# Patient Record
Sex: Female | Born: 1965 | Race: White | Hispanic: No | Marital: Married | Smoking: Never smoker
Health system: Southern US, Community
[De-identification: ages and names within clinical notes are randomized; demographics above are authoritative.]

## PROBLEM LIST (undated history)

## (undated) DIAGNOSIS — N200 Calculus of kidney: Secondary | ICD-10-CM

## (undated) DIAGNOSIS — G610 Guillain-Barre syndrome: Secondary | ICD-10-CM

## (undated) DIAGNOSIS — Z87442 Personal history of urinary calculi: Secondary | ICD-10-CM

## (undated) HISTORY — PX: CYSTOSCOPY W/ URETEROSCOPY W/ LITHOTRIPSY: SUR380

## (undated) HISTORY — PX: OTHER SURGICAL HISTORY: SHX169

---

## 2010-02-08 ENCOUNTER — Ambulatory Visit: Payer: Self-pay | Admitting: Emergency Medicine

## 2014-01-21 DIAGNOSIS — M792 Neuralgia and neuritis, unspecified: Secondary | ICD-10-CM | POA: Insufficient documentation

## 2014-02-19 ENCOUNTER — Ambulatory Visit: Payer: Self-pay | Admitting: Gastroenterology

## 2015-12-29 IMAGING — CT CT ABD-PELV W/ CM
2 of 5 series · 17 of 46 positions shown, 19 images · IV contrast (isovue)
Comparison: None.

CLINICAL DATA: Left lower quadrant pain.

EXAM:
CT ABDOMEN AND PELVIS WITH CONTRAST
TECHNIQUE: Multidetector CT imaging of the abdomen and pelvis was performed
using the standard protocol following bolus administration of
intravenous contrast.
CONTRAST:  85 mL Isovue 370

[Series 2: axial soft tissue · axial · 0.62mm/px · z∈[-786,-426]mm · 14 of 82 slices shown, 16 images]
[im 5/82  soft-tissue]
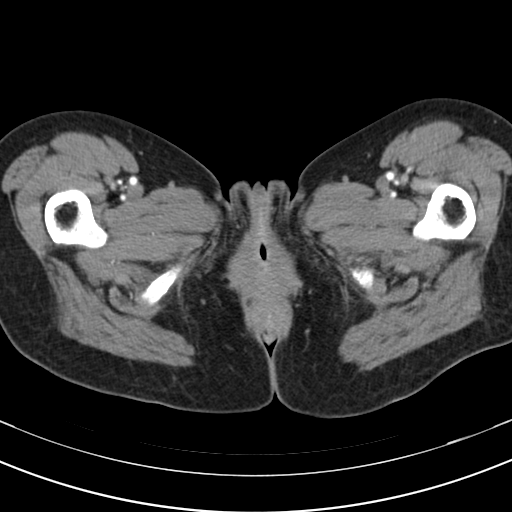
[im 5/82  bone]
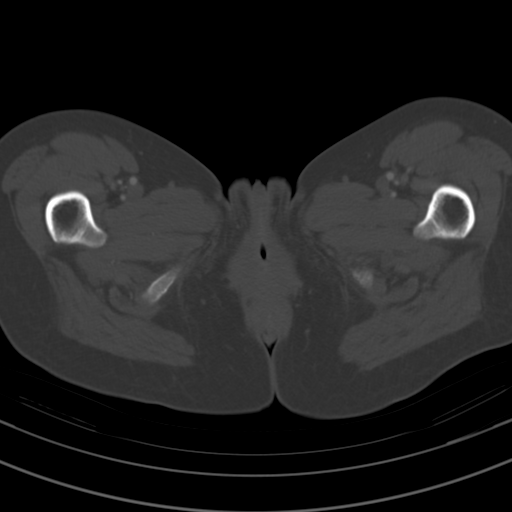
[im 9/82  soft-tissue]
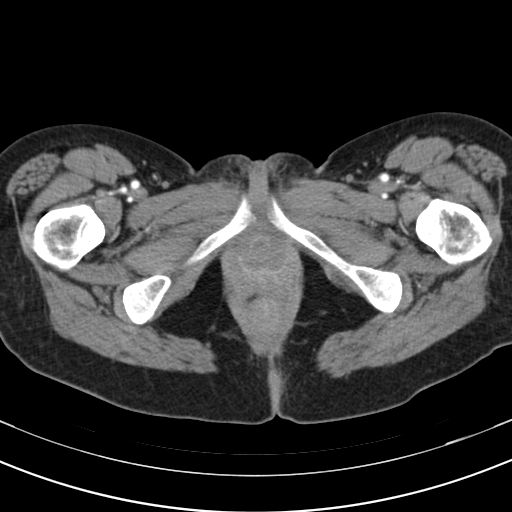
[im 18/82  soft-tissue]
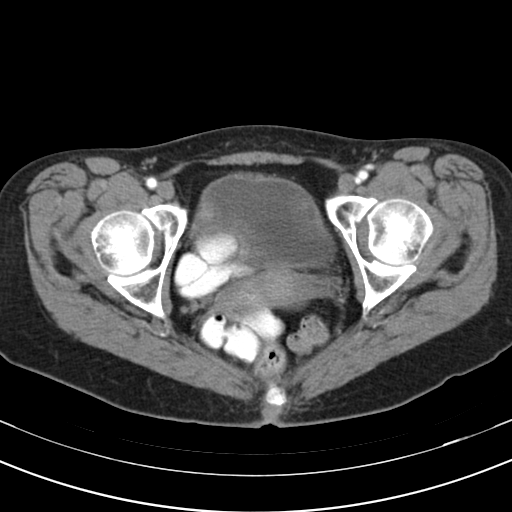
[im 22/82  soft-tissue]
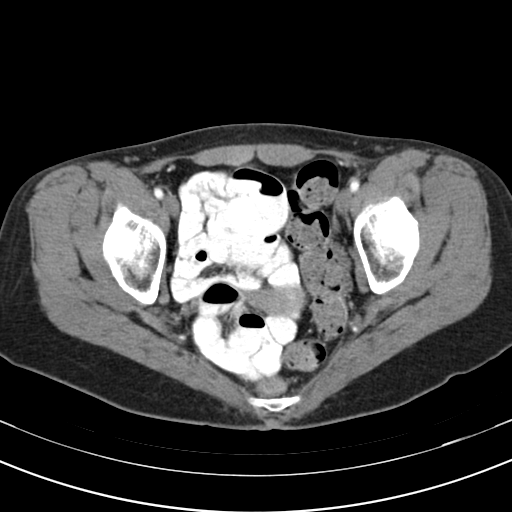
[im 26/82  soft-tissue]
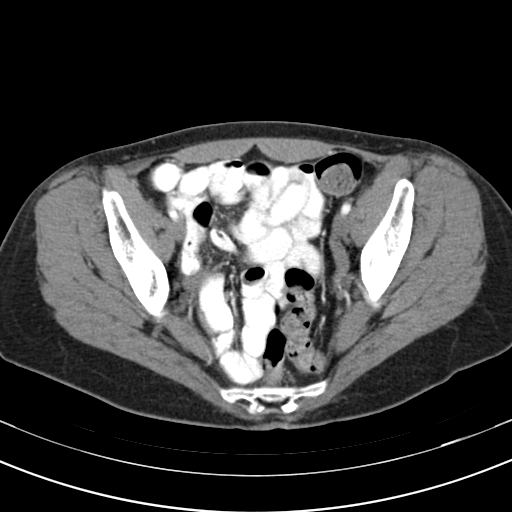
[im 35/82  soft-tissue]
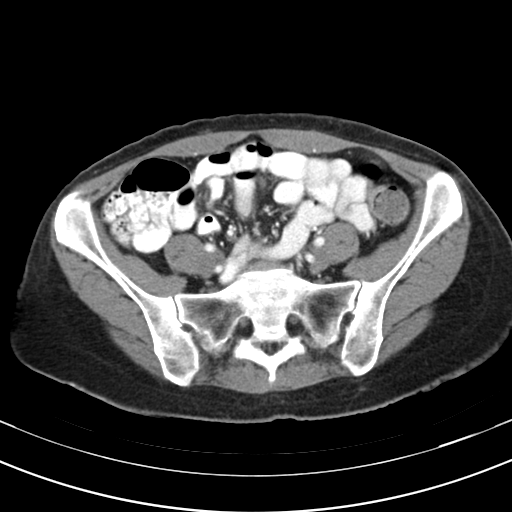
[im 39/82  soft-tissue]
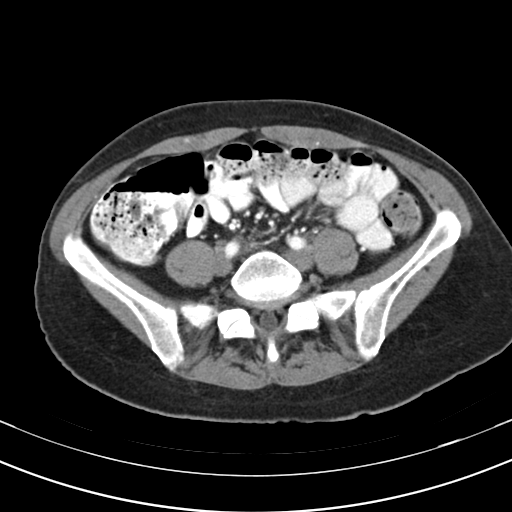
[im 43/82  soft-tissue]
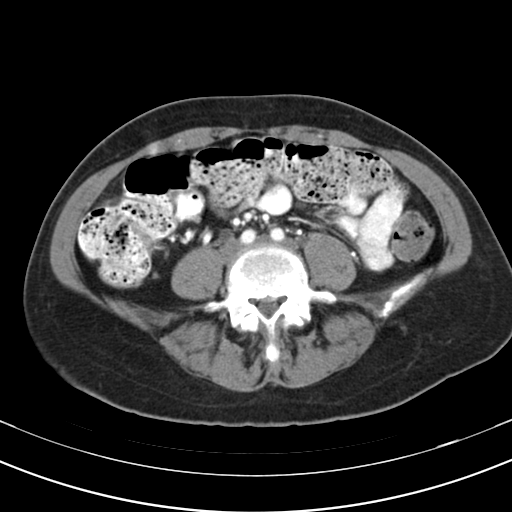
[im 47/82  soft-tissue]
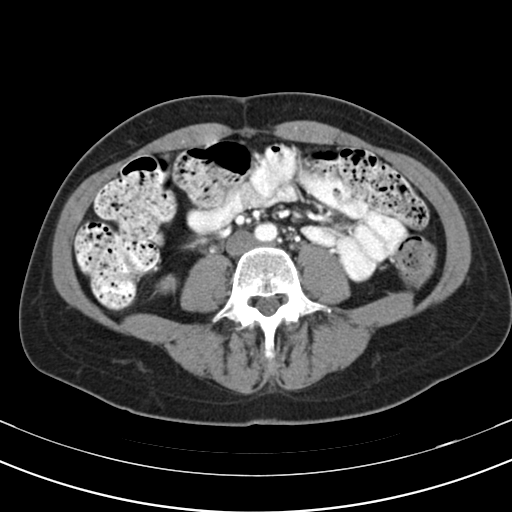
[im 47/82  bone]
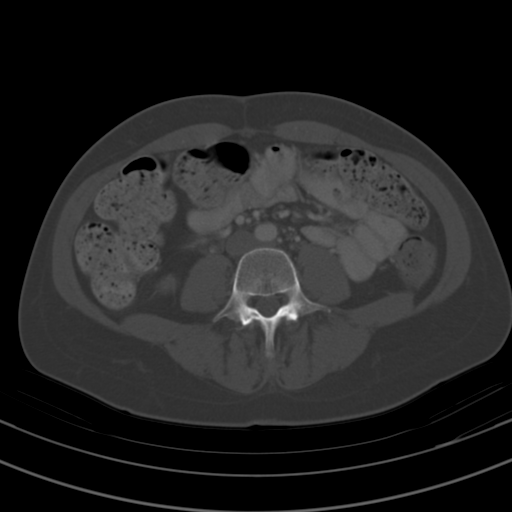
[im 56/82  soft-tissue]
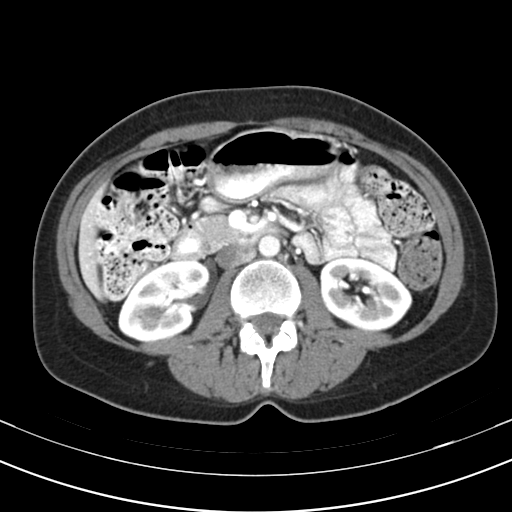
[im 60/82  soft-tissue]
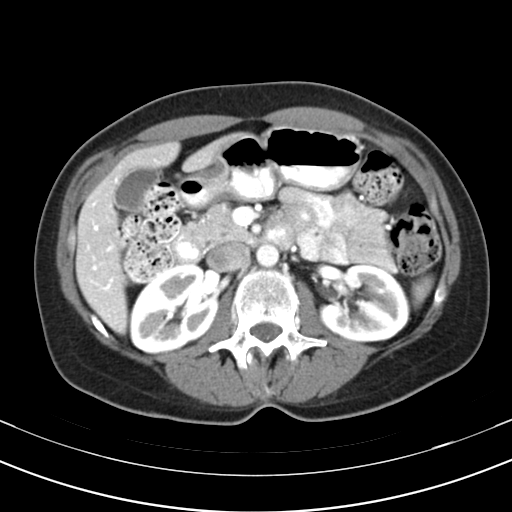
[im 64/82  soft-tissue]
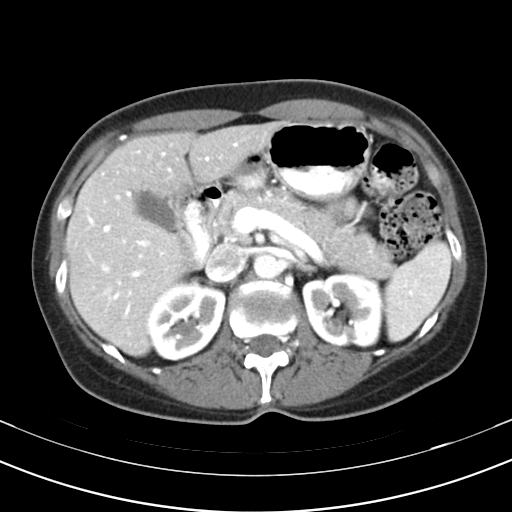
[im 73/82  soft-tissue]
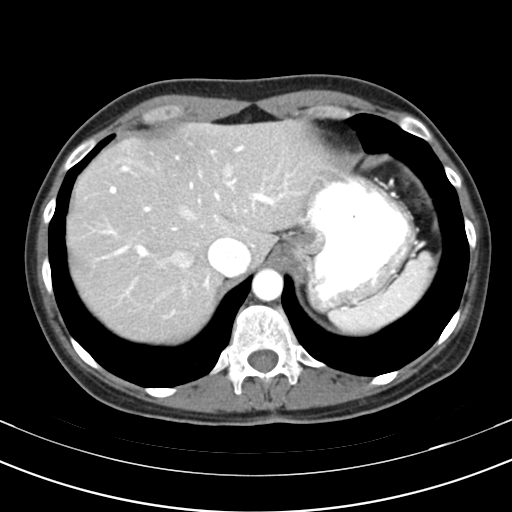
[im 77/82  soft-tissue]
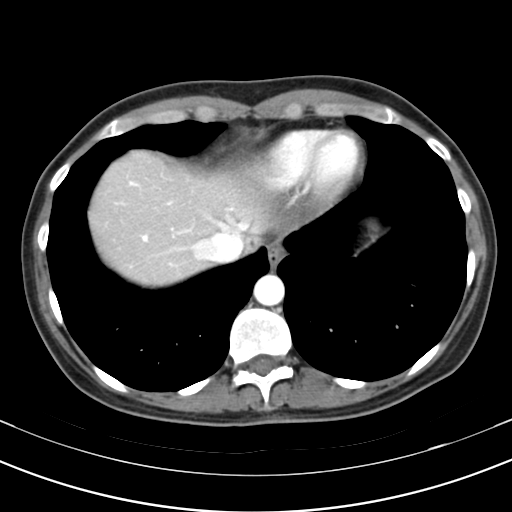

[Series 602: coronals · coronal · 0.80mm/px · 3 of 113 slices shown]
[im 38/113  soft-tissue]
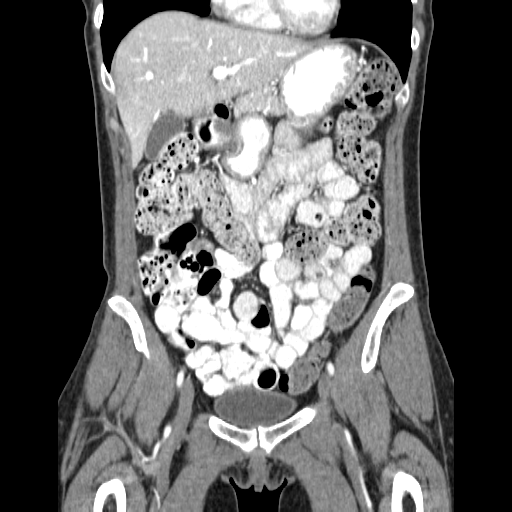
[im 50/113  soft-tissue]
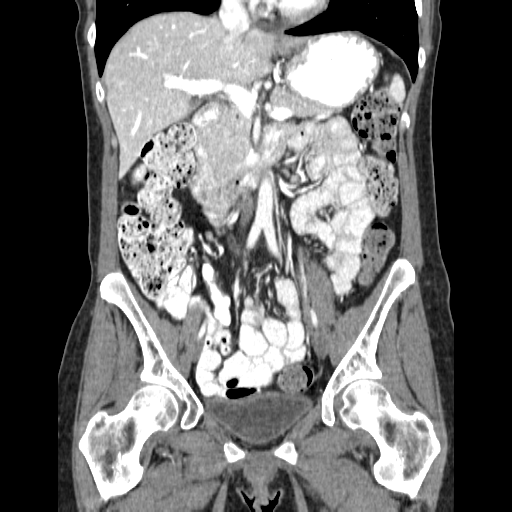
[im 63/113  soft-tissue]
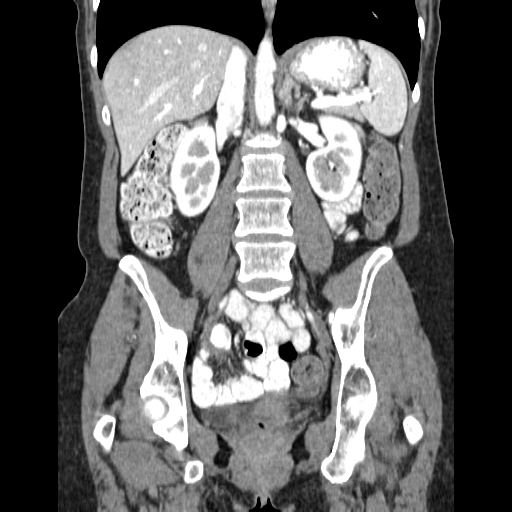

[17 of 46 positions shown; findings below may reference images not displayed]

FINDINGS: Lower Chest:  Unremarkable.

Hepatobiliary: Several tiny sub-cm hepatic cysts are noted, but no
liver masses are identified. Gallbladder is unremarkable.

Pancreas: No mass, inflammatory changes, or other parenchymal
abnormality identified.

Spleen:  Within normal limits in size and appearance.

Adrenal Glands:  No mass identified.

Kidneys/Urinary Tract: No masses identified. No evidence of
hydronephrosis.

Stomach/Bowel/Peritoneum: No evidence of bowel wall thickening,
mass, or obstruction.

Lymph Nodes:  No pathologically enlarged lymph nodes identified.

Reproductive:  No mass or other significant abnormality identified.

Vascular:  No evidence of abdominal aortic aneurysm.

Musculoskeletal:  No suspicious bone lesions identified.

Other:  None.
IMPRESSION: No acute findings or other significant abnormality identified.

## 2016-01-13 ENCOUNTER — Ambulatory Visit
Admission: EM | Admit: 2016-01-13 | Discharge: 2016-01-13 | Disposition: A | Discharge status: Home / Self Care | Payer: Managed Care, Other (non HMO) | Attending: Family Medicine | Admitting: Family Medicine

## 2016-01-13 ENCOUNTER — Encounter: Payer: Self-pay | Admitting: Emergency Medicine

## 2016-01-13 DIAGNOSIS — T63481A Toxic effect of venom of other arthropod, accidental (unintentional), initial encounter: Secondary | ICD-10-CM

## 2016-01-13 HISTORY — DX: Calculus of kidney: N20.0

## 2016-01-13 MED ORDER — RANITIDINE HCL 150 MG PO CAPS: 150.0000 mg | ORAL_CAPSULE | Freq: Two times a day (BID) | ORAL | 0 refills | Status: DC | PRN

## 2016-01-13 MED ORDER — PREDNISONE 10 MG (21) PO TBPK: ORAL_TABLET | ORAL | 0 refills | Status: DC

## 2016-01-13 MED ORDER — SULFAMETHOXAZOLE-TRIMETHOPRIM 800-160 MG PO TABS: 1.0000 | ORAL_TABLET | Freq: Two times a day (BID) | ORAL | 0 refills | Status: AC

## 2016-01-13 MED ORDER — METHYLPREDNISOLONE SODIUM SUCC 125 MG IJ SOLR
125.0000 mg | Freq: Once | INTRAMUSCULAR | Status: AC
  Administered 2016-01-13: 125 mg via INTRAMUSCULAR

## 2016-01-13 MED ORDER — LORATADINE 10 MG PO TABS: 10.0000 mg | ORAL_TABLET | Freq: Every day | ORAL | 0 refills | Status: AC | PRN

## 2016-01-13 NOTE — ED Triage Notes (Signed)
Patient states that she was stung by a wasp yesterday around 3pm on her right wrist .  Patient c/o swelling, redness, and tenderness in her right hand and lower arm.

## 2016-01-13 NOTE — ED Notes (Signed)
Patient shows no signs of adverse reaction to medication at this time.  

## 2016-01-13 NOTE — ED Provider Notes (Signed)
[facility]    CSN: [id number] Arrival date & time: 01/13/16  1636  First Provider Contact:  First MD Initiated Contact with Patient 01/13/16 1725        History   Chief Complaint Chief Complaint  Patient presents with  . Insect Bite    HPI Kaylee Erickson is a 50 y.o. female.   Patient reports insect sting yesterday states that the right arm started to swell quite a bit. She states that she was bitten of couple weeks ago and she had progression of the swelling and the side of irritation and inflammation over 7 days and continue to get worse. This time right arm is much faster and much worse. She didn't have any shortness of breath or difficulty breathing she states. 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 She is had a tetanus recently and had a reaction to that so at this time to try to avoid immunizations. Past smoking history she's also had a kidney stone but no other medical problems. No pertinent family history late to today's visit. She does not smoke no known drug allergies.   The history is provided by the patient. A language interpreter was used.  Animal Bite  Contact animal:  Insect (wasp) Time since incident:  1 day Pain details:    Quality:  Aching and itching   Severity:  Moderate   Progression:  Worsening Provoked: unprovoked   Animal's rabies vaccination status:  Up to date Worsened by:  Nothing   Past Medical History:  Diagnosis Date  . Kidney stone     There are no active problems to display for this patient.   Past Surgical History:  Procedure Laterality Date  . uterine ablation      OB History    No data available       Home Medications    Prior to Admission medications   Medication Sig Start Date End Date Taking? Authorizing Provider  cyanocobalamin 1000 MCG tablet Take 1,000 mcg by mouth daily.   Yes Historical Provider, MD  gabapentin (NEURONTIN) 300 MG  capsule Take 300 mg by mouth at bedtime.   Yes Historical Provider, MD  loratadine (CLARITIN) 10 MG tablet Take 1 tablet (10 mg total) by mouth daily as needed for allergies. For itching 01/13/16   Hassan RowanEugene Tyrik Stetzer, MD  predniSONE (STERAPRED UNI-PAK 21 TAB) 10 MG (21) TBPK tablet 6 tabs day 1 and 2, 5 tabs day 3 and 4, 4 tabs day 5 and 6, 3 tabs day 7 and 8, 2 tabs day 9 and 10, 1 tab day 11 and 12. Take orally 01/13/16   Hassan RowanEugene Nevaan Bunton, MD  ranitidine (ZANTAC) 150 MG capsule Take 1 capsule (150 mg total) by mouth 2 (two) times daily as needed (puritis). 01/13/16   Hassan RowanEugene Artis Buechele, MD  sulfamethoxazole-trimethoprim (BACTRIM DS,SEPTRA DS) 800-160 MG tablet Take 1 tablet by mouth 2 (two) times daily. 01/13/16 01/20/16  Hassan RowanEugene Christalynn Boise, MD    Family History History reviewed. No pertinent family history.  Social History Social History  Substance Use Topics  . Smoking status: Never Smoker  . Smokeless tobacco: Never Used  . Alcohol use Yes     Allergies   Review of patient's allergies indicates no known allergies.   Review of Systems Review of Systems  All other systems reviewed and are negative.    Physical Exam Triage Vital Signs ED Triage Vitals  Enc Vitals Group  BP 01/13/16 1704 115/73     Pulse Rate 01/13/16 1704 84     Resp 01/13/16 1704 16     Temp 01/13/16 1704 97.3 F (36.3 C)     Temp Source 01/13/16 1704 Tympanic     SpO2 01/13/16 1704 100 %     Weight --      Height --      Head Circumference --      Peak Flow --      Pain Score 01/13/16 1709 9     Pain Loc --      Pain Edu? --      Excl. in GC? --    No data found.   Updated Vital Signs BP 115/73 (BP Location: Left Arm)   Pulse 84   Temp 97.3 F (36.3 C) (Tympanic)   Resp 16   SpO2 100%   Visual Acuity Right Eye Distance:   Left Eye Distance:   Bilateral Distance:    Right Eye Near:   Left Eye Near:    Bilateral Near:     Physical Exam  Constitutional: She is oriented to person, place, and time. She  appears well-developed and well-nourished.  HENT:  Head: Normocephalic and atraumatic.  Eyes: Pupils are equal, round, and reactive to light.  Neck: Normal range of motion.  Pulmonary/Chest: Effort normal.  Musculoskeletal: Normal range of motion.  Neurological: She is alert and oriented to person, place, and time.  Skin: Rash noted. There is erythema.  Psychiatric: She has a normal mood and affect.  Vitals reviewed.    UC Treatments / Results  Labs (all labs ordered are listed, but only abnormal results are displayed) Labs Reviewed - No data to display  EKG  EKG Interpretation None       Radiology No results found.  Procedures Procedures (including critical care time)  Medications Ordered in UC Medications  methylPREDNISolone sodium succinate (SOLU-MEDROL) 125 mg/2 mL injection 125 mg (125 mg Intramuscular Given 01/13/16 1731)     Initial Impression / Assessment and Plan / UC Course  I have reviewed the triage vital signs and the nursing notes.  Pertinent labs & imaging results that were available during my care of the patient were reviewed by me and considered in my medical decision making (see chart for details).  Clinical Course    Patient Mentioned of anaphylaxis and insect bites. Also to have insect bites/stings can cause cellulitis. 2 treat the insect reaction that is definitely occurring will give her dose of Solu-Medrol IM prednisone for 6 days Claritin 10 mg daily and Zantac twice a day. Because of possible infection will also place on Septra DS 1 tablet twice a day. Pop PCP if not better.  Final Clinical Impressions(s) / UC Diagnoses   Final diagnoses:  Allergic reaction to insect sting, accidental or unintentional, initial encounter    New Prescriptions New Prescriptions   LORATADINE (CLARITIN) 10 MG TABLET    Take 1 tablet (10 mg total) by mouth daily as needed for allergies. For itching   PREDNISONE (STERAPRED UNI-PAK 21 TAB) 10 MG (21) TBPK  TABLET    6 tabs day 1 and 2, 5 tabs day 3 and 4, 4 tabs day 5 and 6, 3 tabs day 7 and 8, 2 tabs day 9 and 10, 1 tab day 11 and 12. Take orally   RANITIDINE (ZANTAC) 150 MG CAPSULE    Take 1 capsule (150 mg total) by mouth 2 (two) times daily as needed (puritis).  SULFAMETHOXAZOLE-TRIMETHOPRIM (BACTRIM DS,SEPTRA DS) 800-160 MG TABLET    Take 1 tablet by mouth 2 (two) times daily.     Note: This dictation was prepared with Dragon dictation along with smaller phrase technology. Any transcriptional errors that result from this process are unintentional.   Hassan Rowan, MD 01/13/16 1743

## 2017-02-01 ENCOUNTER — Other Ambulatory Visit: Payer: Self-pay | Admitting: Family Medicine

## 2017-02-01 DIAGNOSIS — R1011 Right upper quadrant pain: Secondary | ICD-10-CM

## 2017-02-06 ENCOUNTER — Ambulatory Visit
Admission: RE | Admit: 2017-02-06 | Discharge: 2017-02-06 | Disposition: A | Discharge status: Home / Self Care | Payer: Managed Care, Other (non HMO) | Source: Ambulatory Visit | Attending: Family Medicine | Admitting: Family Medicine

## 2017-02-06 DIAGNOSIS — R1011 Right upper quadrant pain: Secondary | ICD-10-CM

## 2018-01-24 ENCOUNTER — Ambulatory Visit (INDEPENDENT_AMBULATORY_CARE_PROVIDER_SITE_OTHER): Payer: Managed Care, Other (non HMO) | Admitting: Obstetrics & Gynecology

## 2018-01-24 ENCOUNTER — Other Ambulatory Visit (HOSPITAL_COMMUNITY)
Admission: RE | Admit: 2018-01-24 | Discharge: 2018-01-24 | Disposition: A | Discharge status: Home / Self Care | Payer: Managed Care, Other (non HMO) | Source: Ambulatory Visit | Attending: Obstetrics & Gynecology | Admitting: Obstetrics & Gynecology

## 2018-01-24 ENCOUNTER — Encounter: Payer: Self-pay | Admitting: Obstetrics & Gynecology

## 2018-01-24 VITALS — BP 110/60 | HR 103 | Ht 62.0 in | Wt 115.0 lb

## 2018-01-24 DIAGNOSIS — Z1231 Encounter for screening mammogram for malignant neoplasm of breast: Secondary | ICD-10-CM

## 2018-01-24 DIAGNOSIS — Z124 Encounter for screening for malignant neoplasm of cervix: Secondary | ICD-10-CM | POA: Diagnosis not present

## 2018-01-24 DIAGNOSIS — R1030 Lower abdominal pain, unspecified: Secondary | ICD-10-CM | POA: Diagnosis not present

## 2018-01-24 DIAGNOSIS — Z1239 Encounter for other screening for malignant neoplasm of breast: Secondary | ICD-10-CM

## 2018-01-24 DIAGNOSIS — Z0000 Encounter for general adult medical examination without abnormal findings: Secondary | ICD-10-CM

## 2018-01-24 DIAGNOSIS — Z01411 Encounter for gynecological examination (general) (routine) with abnormal findings: Secondary | ICD-10-CM | POA: Diagnosis not present

## 2018-01-24 DIAGNOSIS — Z1211 Encounter for screening for malignant neoplasm of colon: Secondary | ICD-10-CM

## 2018-01-24 NOTE — Patient Instructions (Signed)
PAP every three years Mammogram every year    Call [phone] to schedule at [place] [place] Colonoscopy every 10 years Labs yearly (with PCP) Ultrasound soon

## 2018-01-24 NOTE — Progress Notes (Signed)
HPI:      [name] is a 52 y.o. G2P2002 who LMP was years ago (has had Ablation), she presents today for her annual examination. The patient has no complaints today. The patient is sexually active. Her last pap: approximate date 2015 and was normal and last mammogram: approximate date 2015 and was normal. The patient does perform self breast exams.  There is no notable family history of breast or ovarian cancer in her family.  The patient has regular exercise: yes.  The patient denies current symptoms of depression.    Also:  Pt c/o constant BLQ pain L>R not associated w GI or bowel changes, no periods and no cyclical nature to her sx's.  For one year, mild-mod.  No modifiers or assoc sx's.  No dyspareunia.  No menopausal sx's. Always has had intermittant constipation and diarrhea.  No bladder concerns.  PMHx: Past Medical History:  Diagnosis Date  . Kidney stone    Past Surgical History:  Procedure Laterality Date  . uterine ablation     History reviewed. No pertinent family history. Social History   Tobacco Use  . Smoking status: Never Smoker  . Smokeless tobacco: Never Used  Substance Use Topics  . Alcohol use: Yes  . Drug use: No    Current Outpatient Medications:  .  gabapentin (NEURONTIN) 300 MG capsule, Take 300 mg by mouth at bedtime., Disp: , Rfl:  .  cyanocobalamin 1000 MCG tablet, Take 1,000 mcg by mouth daily., Disp: , Rfl:  .  loratadine (CLARITIN) 10 MG tablet, Take 1 tablet (10 mg total) by mouth daily as needed for allergies. For itching (Patient not taking: Reported on 01/24/2018), Disp: 15 tablet, Rfl: 0 .  predniSONE (STERAPRED UNI-PAK 21 TAB) 10 MG (21) TBPK tablet, 6 tabs day 1 and 2, 5 tabs day 3 and 4, 4 tabs day 5 and 6, 3 tabs day 7 and 8, 2 tabs day 9 and 10, 1 tab day 11 and 12. Take orally (Patient not taking: Reported on 01/24/2018), Disp: 42 tablet, Rfl: 0 .  ranitidine (ZANTAC) 150 MG capsule, Take 1 capsule (150 mg total) by mouth 2 (two) times  daily as needed (puritis). (Patient not taking: Reported on 01/24/2018), Disp: 30 capsule, Rfl: 0 Allergies: Lactase; Other; and Wasp venom  Review of Systems  Constitutional: Negative for chills, fever and malaise/fatigue.  HENT: Negative for congestion, sinus pain and sore throat.   Eyes: Negative for blurred vision and pain.  Respiratory: Negative for cough and wheezing.   Cardiovascular: Negative for chest pain and leg swelling.  Gastrointestinal: Positive for constipation and diarrhea. Negative for abdominal pain, heartburn, nausea and vomiting.  Genitourinary: Negative for dysuria, frequency, hematuria and urgency.  Musculoskeletal: Negative for back pain, joint pain, myalgias and neck pain.  Skin: Negative for itching and rash.  Neurological: Negative for dizziness, tremors and weakness.  Endo/Heme/Allergies: Does not bruise/bleed easily.  Psychiatric/Behavioral: Negative for depression. The patient is not nervous/anxious and does not have insomnia.     Objective: BP 110/60   Pulse (!) 103   Ht 5\' 2"  (1.575 m)   Wt 115 lb (52.2 kg)   BMI 21.03 kg/m   Filed Weights   01/24/18 1347  Weight: 115 lb (52.2 kg)   Body mass index is 21.03 kg/m. Physical Exam  Constitutional: She is oriented to person, place, and time. She appears well-developed and well-nourished. No distress.  Genitourinary: Rectum normal, vagina normal and uterus normal. Pelvic exam was performed  with patient supine. There is no rash or lesion on the right labia. There is no rash or lesion on the left labia. Vagina exhibits no lesion. No bleeding in the vagina. Right adnexum does not display mass and does not display tenderness.  Left adnexum displays tenderness and displays fullness. Left adnexum does not display mass. Cervix does not exhibit motion tenderness, lesion, friability or polyp.   Uterus is mobile and midaxial. Uterus is not enlarged or exhibiting a mass.  HENT:  Head: Normocephalic and atraumatic.  Head is without laceration.  Right Ear: Hearing normal.  Left Ear: Hearing normal.  Nose: No epistaxis.  No foreign bodies.  Mouth/Throat: Uvula is midline, oropharynx is clear and moist and mucous membranes are normal.  Eyes: Pupils are equal, round, and reactive to light.  Neck: Normal range of motion. Neck supple. No thyromegaly present.  Cardiovascular: Normal rate and regular rhythm. Exam reveals no gallop and no friction rub.  No murmur heard. Pulmonary/Chest: Effort normal and breath sounds normal. No respiratory distress. She has no wheezes. Right breast exhibits no mass, no skin change and no tenderness. Left breast exhibits no mass, no skin change and no tenderness.  Abdominal: Soft. Bowel sounds are normal. She exhibits no distension. There is no tenderness. There is no rebound.  Musculoskeletal: Normal range of motion.  Neurological: She is alert and oriented to person, place, and time. No cranial nerve deficit.  Skin: Skin is warm and dry.  Psychiatric: She has a normal mood and affect. Judgment normal.  Vitals reviewed.  Assessment:  ANNUAL EXAM 1. Annual physical exam   2. Screening for cervical cancer   3. Screening for breast cancer   4. Screen for colon cancer   5. Lower abdominal pain    Screening Plan:            1.  Cervical Screening-  Pap smear done today  2. Breast screening- Exam annually and mammogram>40 planned   3. Colonoscopy every 10 years, Hemoccult testing - after age 6  4. Labs managed by PCP  5. Counseling for contraception: no method    6. Lower abdominal pain [place] to assess pain      F/U  Return in about 1 week (around 01/31/2018) for Follow up w GYN [place].  Annamarie Major, MD, [facility], [facility] 01/24/2018  2:27 PM

## 2018-01-25 LAB — CYTOLOGY - PAP
DIAGNOSIS: NEGATIVE
HPV (WINDOPATH): NOT DETECTED

## 2018-02-06 ENCOUNTER — Encounter: Payer: Self-pay | Admitting: Obstetrics & Gynecology

## 2018-02-06 ENCOUNTER — Ambulatory Visit (INDEPENDENT_AMBULATORY_CARE_PROVIDER_SITE_OTHER): Payer: Managed Care, Other (non HMO) | Admitting: Obstetrics & Gynecology

## 2018-02-06 ENCOUNTER — Ambulatory Visit (INDEPENDENT_AMBULATORY_CARE_PROVIDER_SITE_OTHER): Payer: Managed Care, Other (non HMO)

## 2018-02-06 VITALS — BP 118/80 | Ht 62.0 in | Wt 115.0 lb

## 2018-02-06 DIAGNOSIS — R1030 Lower abdominal pain, unspecified: Secondary | ICD-10-CM

## 2018-02-06 NOTE — Progress Notes (Signed)
  HPI: Pt has intermittent lower abdominal pain, associated w activity and diet, mild-moderate, no radiation, no modifiers, associated with constipation then diarrhea.  No periods, as has had prior endometrial ablation.  Ultrasound demonstrates no masses seen, No cysts, no endometrial abnormality  PMHx: She  has a past medical history of Kidney stone. Also,  has a past surgical history that includes uterine ablation., family history is not on file.,  reports that she has never smoked. She has never used smokeless tobacco. She reports that she drinks alcohol. She reports that she does not use drugs.  She has a current medication list which includes the following prescription(s): cyanocobalamin, gabapentin, loratadine, prednisone, and ranitidine. Also, is allergic to lactase; other; and wasp venom.  Review of Systems  All other systems reviewed and are negative.  Objective: BP 118/80   Ht 5\' 2"  (1.575 m)   Wt 115 lb (52.2 kg)   BMI 21.03 kg/m   Physical examination Constitutional NAD, Conversant  Skin No rashes, lesions or ulceration.   Extremities: Moves all appropriately.  Normal ROM for age. No lymphadenopathy.  Neuro: Grossly intact  Psych: Oriented to PPT.  Normal mood. Normal affect.   Assessment:  Lower abdominal pain of unknown etiology    No sign of endometrial abnormality s/p ablation; ovarian cyst or mass, tubal edema or inflammation, fibroid, or other Gyn etiology.    Possibility for pelvic floor/ MS disorder is present; offered pelvic floor rehabilitation PT     Also to consider GI etiology; pt prefers referral for this  A total of 15 minutes were spent face-to-face with the patient during this encounter and over half of that time dealt with counseling and coordination of care.  Annamarie Major, MD, [facility], [facility] 02/06/2018  10:12 AM

## 2018-03-25 ENCOUNTER — Encounter: Payer: Self-pay | Admitting: Gastroenterology

## 2018-03-25 ENCOUNTER — Ambulatory Visit: Payer: Managed Care, Other (non HMO) | Admitting: Gastroenterology

## 2018-03-25 VITALS — BP 147/68 | HR 80 | Ht 62.0 in | Wt 116.0 lb

## 2018-03-25 DIAGNOSIS — R1032 Left lower quadrant pain: Secondary | ICD-10-CM | POA: Diagnosis not present

## 2018-03-25 DIAGNOSIS — K580 Irritable bowel syndrome with diarrhea: Secondary | ICD-10-CM

## 2018-03-25 MED ORDER — DICYCLOMINE HCL 10 MG PO CAPS: 10.0000 mg | ORAL_CAPSULE | Freq: Three times a day (TID) | ORAL | 3 refills | Status: AC

## 2018-03-25 NOTE — Progress Notes (Signed)
Gastroenterology Consultation  Referring Provider:     Nadara Mustard, MD Primary Care Physician:  Marina Goodell, MD Primary Gastroenterologist:  Dr. Servando Snare     Reason for Consultation:     Left lower quadrant pain        HPI:   Kaylee Erickson is a 52 y.o. y/o female referred for consultation & management of left lower quadrant pain by Dr. Maryjane Hurter, Madaline Guthrie, MD.  This patient comes in today with a history of having a colonoscopy in 2011 by Dr. Cecelia Byars which was reported to be normal.  The patient was reporting left lower quadrant pain at that time.  The patient was then seen at Dr. Earnest Conroy office by his nurse practitioner and a CT scan was done which was reported not to show any cause for her continued left lower quadrant pain.  The patient was started on dicyclomine and probiotics at that time.  The patient was recently evaluated by her gynecologist for the abdominal pain and referred to me.  The patient has had exacerbation of the pain with activity and diet.  The patient also has a history of kidney stones.  The patient also had a right upper quadrant ultrasound in September 2018 that was unremarkable and a pelvic ultrasound in September 2019 that was unremarkable.  The patient reports that her abdominal pain has not been present for the last 3 weeks.  She does not remember if the dicyclomine was helping her when she tried it.  Besides the abdominal pain the patient reports that she has some alternating diarrhea and constipation but mostly diarrhea.  She states that her diarrhea is usually in the morning and she has multiple episodes of diarrhea in the morning.  There is no report of any unexplained weight loss black stools or bloody stools.  She also states that she thinks she may have gained some weight.  Past Medical History:  Diagnosis Date  . Kidney stone     Past Surgical History:  Procedure Laterality Date  . uterine ablation      Prior to Admission medications   Medication  Sig Start Date End Date Taking? Authorizing Provider  cyanocobalamin 1000 MCG tablet Take 1,000 mcg by mouth daily.    [provider]  gabapentin (NEURONTIN) 300 MG capsule Take 300 mg by mouth at bedtime.    [provider]  loratadine (CLARITIN) 10 MG tablet Take 1 tablet (10 mg total) by mouth daily as needed for allergies. For itching 01/13/16   Hassan Rowan, MD  predniSONE (STERAPRED UNI-PAK 21 TAB) 10 MG (21) TBPK tablet 6 tabs day 1 and 2, 5 tabs day 3 and 4, 4 tabs day 5 and 6, 3 tabs day 7 and 8, 2 tabs day 9 and 10, 1 tab day 11 and 12. Take orally 01/13/16   Hassan Rowan, MD  ranitidine (ZANTAC) 150 MG capsule Take 1 capsule (150 mg total) by mouth 2 (two) times daily as needed (puritis). 01/13/16   Hassan Rowan, MD    No family history on file.   Social History   Tobacco Use  . Smoking status: Never Smoker  . Smokeless tobacco: Never Used  Substance Use Topics  . Alcohol use: Yes  . Drug use: No    Allergies as of 03/25/2018 - Review Complete 02/06/2018  Allergen Reaction Noted  . Lactase Other (See Comments) 02/13/2014  . Other Other (See Comments) 02/13/2014  . Wasp venom Swelling 02/01/2016    Review of  Systems:    All systems reviewed and negative except where noted in HPI.   Physical Exam:  There were no vitals taken for this visit. No LMP recorded. Patient is postmenopausal. General:   Alert,  Well-developed, well-nourished, pleasant and cooperative in NAD Head:  Normocephalic and atraumatic. Eyes:  Sclera clear, no icterus.   Conjunctiva pink. Ears:  Normal auditory acuity. Nose:  No deformity, discharge, or lesions. Mouth:  No deformity or lesions,oropharynx pink & moist. Neck:  Supple; no masses or thyromegaly. Lungs:  Respirations even and unlabored.  Clear throughout to auscultation.   No wheezes, crackles, or rhonchi. No acute distress. Heart:  Regular rate and rhythm; no murmurs, clicks, rubs, or gallops. Abdomen:  Normal bowel  sounds.  No bruits.  Soft, non-tender and non-distended without masses, hepatosplenomegaly or hernias noted.  No guarding or rebound tenderness.  Negative Carnett sign.   Rectal:  Deferred.  Msk:  Symmetrical without gross deformities.  Good, equal movement & strength bilaterally. Pulses:  Normal pulses noted. Extremities:  No clubbing or edema.  No cyanosis. Neurologic:  Alert and oriented x3;  grossly normal neurologically. Skin:  Intact without significant lesions or rashes.  No jaundice. Lymph Nodes:  No significant cervical adenopathy. Psych:  Alert and cooperative. Normal mood and affect.  Imaging Studies: No results found.  Assessment and Plan:   Kaylee Erickson is a 52 y.o. y/o female who comes in today with a history of abdominal pain in the left lower quadrant that is not present at this time.  The patient has been told to try and flex her abdominal wall muscles when she has the pain to see if the pain is reproducible since it is associated with movement.  The patient has been told to increase fiber in her diet and has been told to take Citrucel or Benefiber right before going to sleep thereby solidifying the stools so she has less diarrhea in the morning.  She is also been told that she can try Imodium if the diarrhea gets too bothersome.  The patient will be started on a low dose of dicyclomine at 10 mg 3 times a day.  The patient has been explained the plan and agrees with it.  Midge Minium, MD. Clementeen Graham    Note: This dictation was prepared with Dragon dictation along with smaller phrase technology. Any transcriptional errors that result from this process are unintentional.

## 2018-04-12 ENCOUNTER — Telehealth: Payer: Self-pay | Admitting: Gastroenterology

## 2018-04-12 NOTE — Telephone Encounter (Signed)
Pt has been advised that if she is still having the abdominal discomfort, she can up her dose to 20mg  daily. I also advised pt per Dr. Annabell Sabal office visit note, that he recommended adding imodium to her regimen if the diarrhea continues to bother her. Pt stated she had not added any imodium because she can get constipated pretty quickly I told her to only take 1 daily and not 2 as directed on the box and to alternate days. She will try this for a week or two and will call me back for an update.

## 2018-04-12 NOTE — Telephone Encounter (Signed)
Patient called and is taking 10 mg dicyclomine and benefiber. Patient having diarrhea everyday. Should she up the mg on the dicyclomine?

## 2018-05-01 ENCOUNTER — Telehealth: Payer: Self-pay

## 2018-05-01 NOTE — Telephone Encounter (Signed)
Called pt to remind her to schedule her mammo , left message

## 2018-10-25 IMAGING — US US ABDOMEN LIMITED
1 series · 14 of 25 positions shown · non-contrast
Comparison: Abdominal and pelvic CT scan February 19, 2014.

CLINICAL DATA: Several day history of right upper quadrant pain.

EXAM:
ULTRASOUND ABDOMEN LIMITED RIGHT UPPER QUADRANT

[Series 1: us abdomen limited · 0.17mm/px · 14 of 52 slices shown]
[im 1/52]
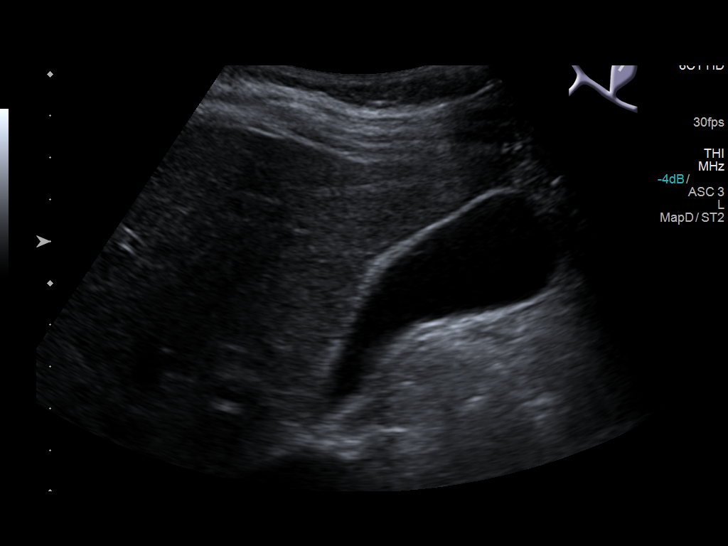
[im 5/52]
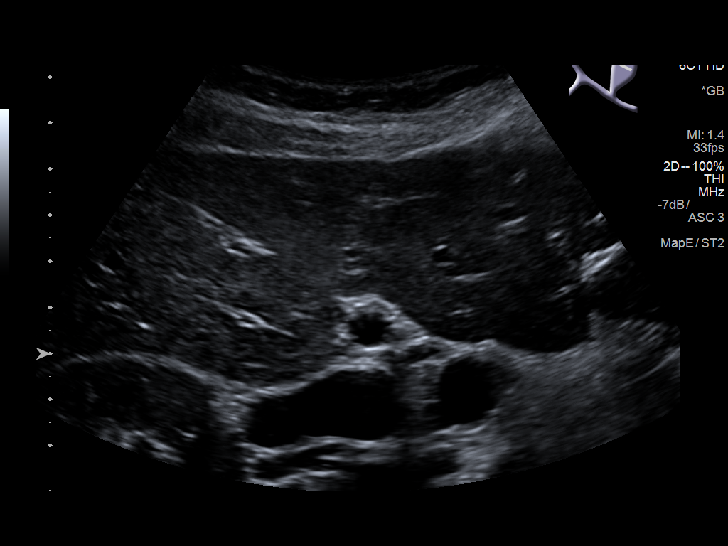
[im 9/52]
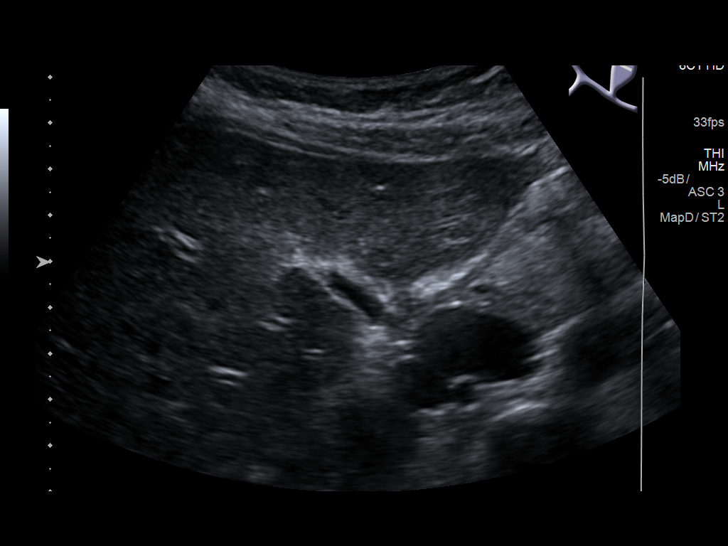
[im 13/52]
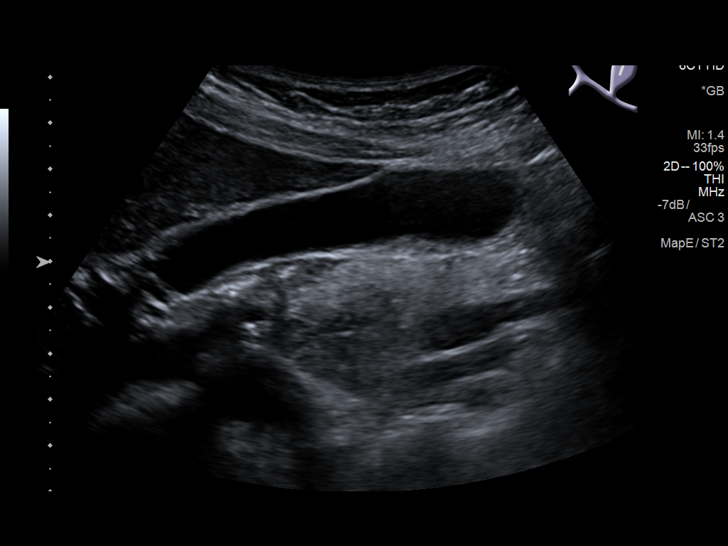
[im 18/52]
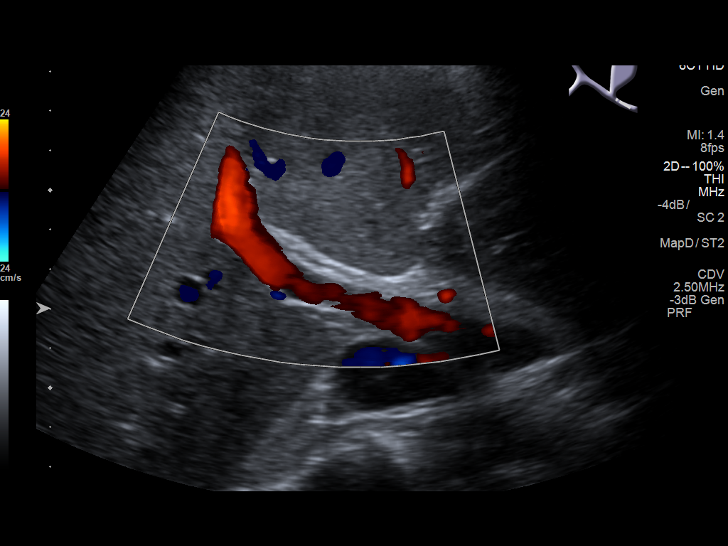
[im 20/52]
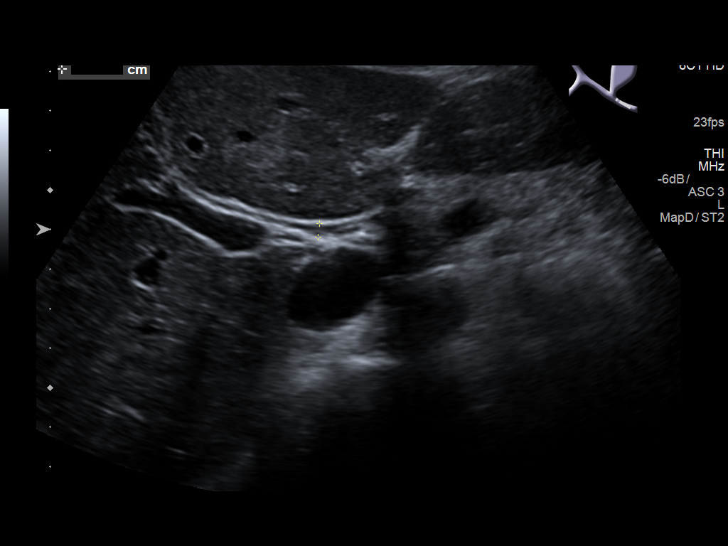
[im 24/52]
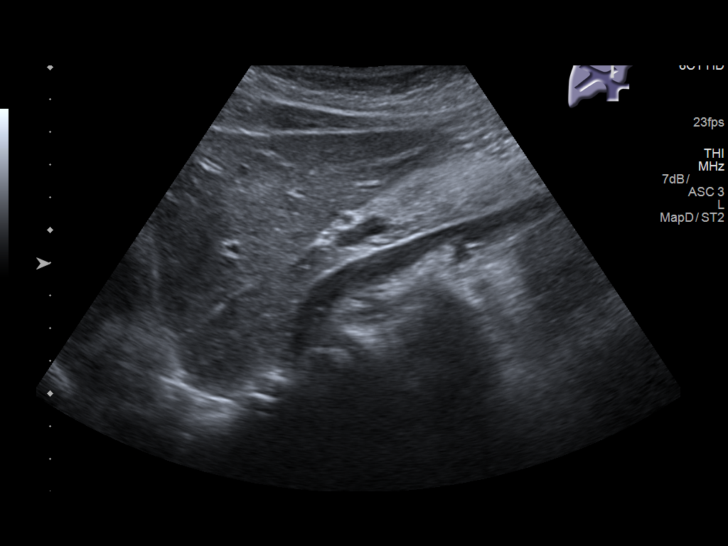
[im 28/52]
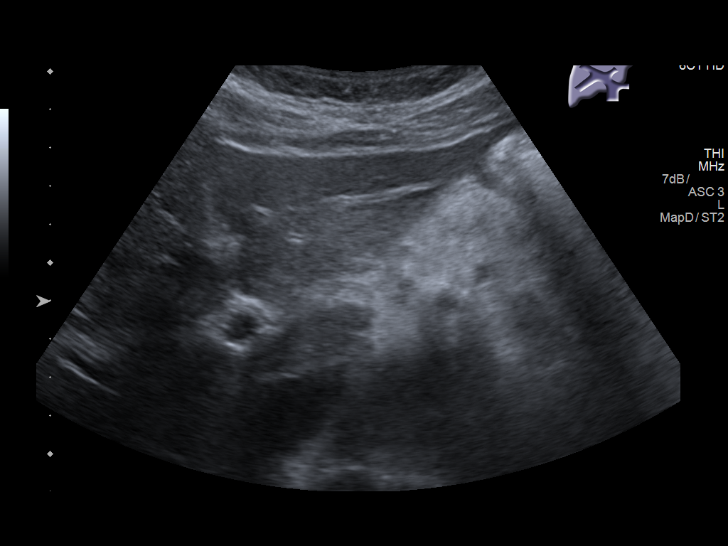
[im 32/52]
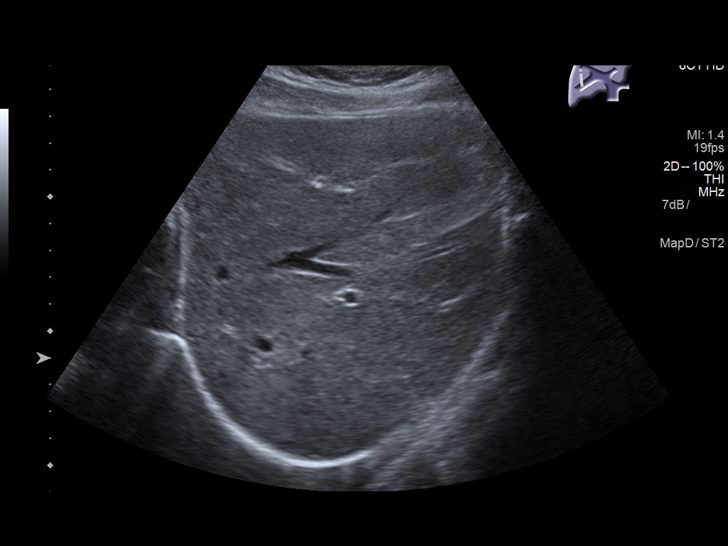
[im 35/52]
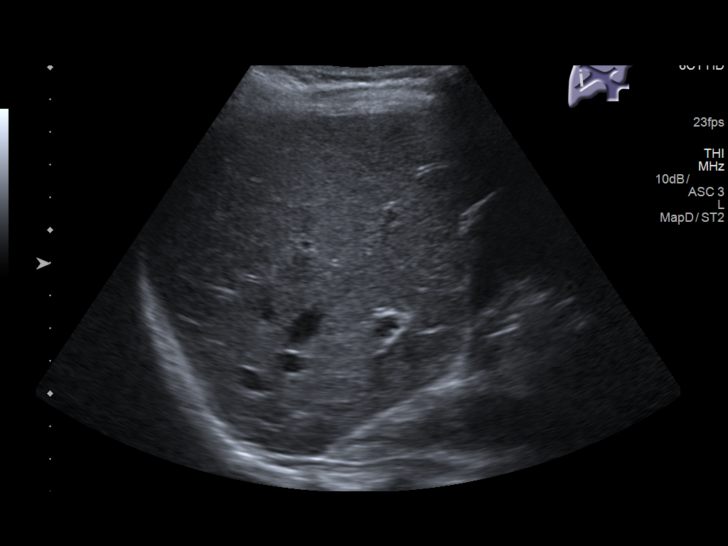
[im 39/52]
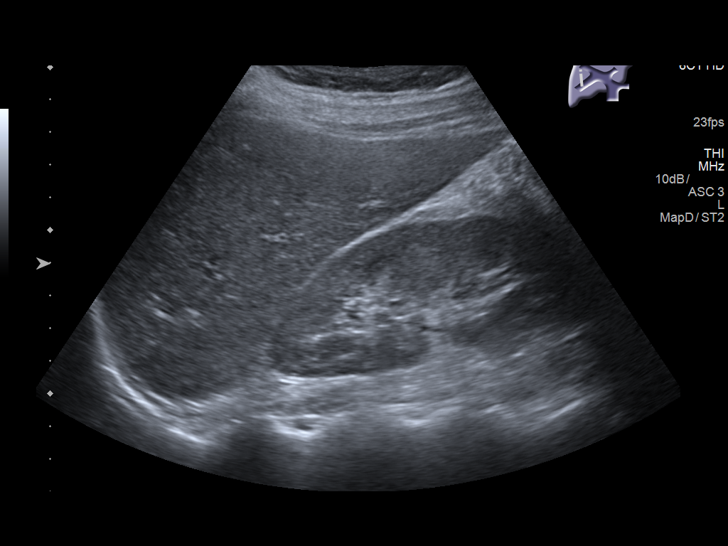
[im 43/52]
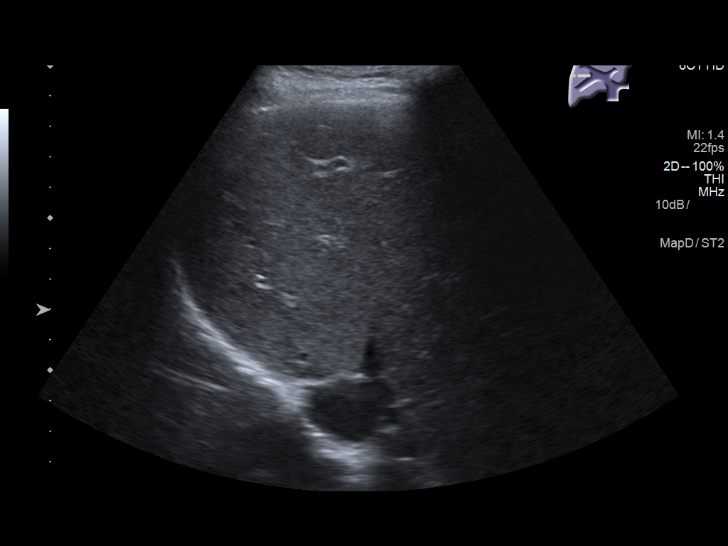
[im 47/52]
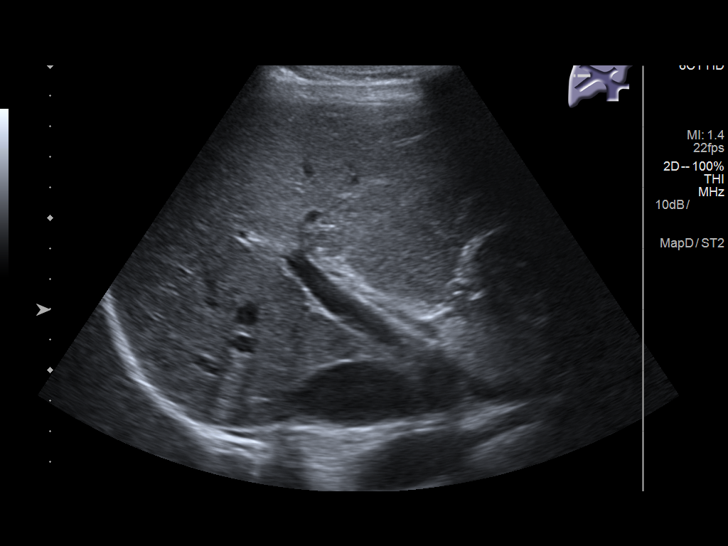
[im 52/52]
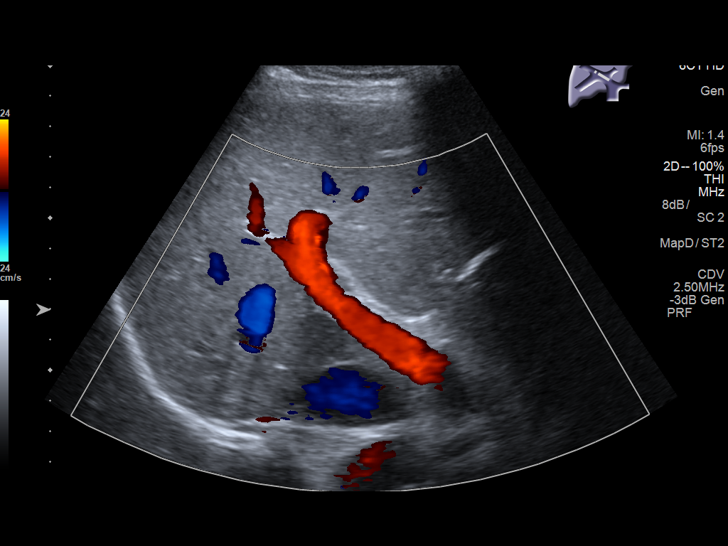

[14 of 25 positions shown; findings below may reference images not displayed]

FINDINGS: Gallbladder:

No gallstones or wall thickening visualized. No sonographic Murphy
sign noted by sonographer.

Common bile duct:

Diameter: 2.9 mm

Liver:

No focal lesion identified. Within normal limits in parenchymal
echogenicity. Portal vein is patent on color Doppler imaging with
normal direction of blood flow towards the liver.
IMPRESSION: Normal right upper quadrant ultrasound. If gallbladder dysfunction
is suspected clinically, a nuclear medicine hepatobiliary scan with
gallbladder ejection fraction determination may be useful.

## 2019-02-14 ENCOUNTER — Other Ambulatory Visit (HOSPITAL_COMMUNITY): Payer: Self-pay | Admitting: Family Medicine

## 2019-02-14 ENCOUNTER — Other Ambulatory Visit: Payer: Self-pay | Admitting: Family Medicine

## 2019-02-14 DIAGNOSIS — R1011 Right upper quadrant pain: Secondary | ICD-10-CM

## 2019-02-18 ENCOUNTER — Ambulatory Visit
Admission: RE | Admit: 2019-02-18 | Discharge: 2019-02-18 | Disposition: A | Discharge status: Home / Self Care | Payer: No Typology Code available for payment source | Source: Ambulatory Visit | Attending: Family Medicine | Admitting: Family Medicine

## 2019-02-18 ENCOUNTER — Other Ambulatory Visit: Payer: Self-pay

## 2019-02-18 DIAGNOSIS — R1011 Right upper quadrant pain: Secondary | ICD-10-CM | POA: Diagnosis not present

## 2019-11-11 ENCOUNTER — Other Ambulatory Visit: Payer: Self-pay

## 2019-11-11 ENCOUNTER — Ambulatory Visit (INDEPENDENT_AMBULATORY_CARE_PROVIDER_SITE_OTHER): Payer: No Typology Code available for payment source | Admitting: Gastroenterology

## 2019-11-11 VITALS — BP 153/82 | HR 88 | Temp 98.2°F | Ht 62.0 in | Wt 116.0 lb

## 2019-11-11 DIAGNOSIS — R197 Diarrhea, unspecified: Secondary | ICD-10-CM

## 2019-11-11 DIAGNOSIS — K580 Irritable bowel syndrome with diarrhea: Secondary | ICD-10-CM | POA: Diagnosis not present

## 2019-11-11 MED ORDER — RIFAXIMIN 550 MG PO TABS
550.0000 mg | ORAL_TABLET | Freq: Three times a day (TID) | ORAL | 0 refills | Status: AC
Start: 2019-11-11 — End: 2019-11-25

## 2019-11-11 NOTE — Addendum Note (Signed)
Addended by: Daisy Blossom on: 11/11/2019 03:14 PM   Modules accepted: Orders

## 2019-11-11 NOTE — Progress Notes (Signed)
Kaylee Mood MD, MRCP(U.K) [address]  [address]  [place], [place] [address]  Main: [phone]  Fax: [phone]   Gastroenterology Consultation  Referring Provider:     Marina Goodell, MD Primary Care Physician:  Marina Goodell, MD Primary Gastroenterologist:  Dr. Wyline Erickson  Reason for Consultation:     IBS        HPI:   Kaylee Erickson is a 54 y.o. y/o female referred for consultation & management  by Dr. Maryjane Hurter, Madaline Guthrie, MD.     She was last seen in our office in October 2019 by Dr. Servando Snare. Seen by Dr. Mechele Collin previously.  02/18/2019: Right upper quadrant ultrasound negative 02/10/2019 hemoglobin 13.1, MCV 101, CMP- normal . Last colonoscopy in 2011.  Recently worsening of diarrhea , has one after every meal, some blood at times and mucus, no abdominal pain, gas and bloating +.   Past Medical History:  Diagnosis Date  . Kidney stone     Past Surgical History:  Procedure Laterality Date  . uterine ablation      Prior to Admission medications   Medication Sig Start Date End Date Taking? Authorizing Provider  cyanocobalamin 1000 MCG tablet Take 1,000 mcg by mouth daily.    [provider]  dicyclomine (BENTYL) 10 MG capsule Take 1 capsule (10 mg total) by mouth 4 (four) times daily -  before meals and at bedtime. Patient not taking: Reported on 11/11/2019 03/25/18   Kaylee Minium, MD  gabapentin (NEURONTIN) 300 MG capsule Take 300 mg by mouth at bedtime.    [provider]  loratadine (CLARITIN) 10 MG tablet Take 1 tablet (10 mg total) by mouth daily as needed for allergies. For itching Patient not taking: Reported on 03/25/2018 01/13/16   Hassan Rowan, MD    No family history on file.   Social History   Tobacco Use  . Smoking status: Never Smoker  . Smokeless tobacco: Never Used  Substance Use Topics  . Alcohol use: Yes  . Drug use: No    Allergies as of 11/11/2019 - Review Complete 11/11/2019  Allergen Reaction Noted  .  Lactase Other (See Comments) 02/13/2014  . Other Other (See Comments) 02/13/2014  . Wasp venom Swelling 02/01/2016    Review of Systems:    All systems reviewed and negative except where noted in HPI.   Physical Exam:  BP (!) 153/82   Pulse 88   Temp 98.2 F (36.8 C)   Ht 5\' 2"  (1.575 m)   Wt 116 lb (52.6 kg)   BMI 21.22 kg/m  No LMP recorded. Patient is postmenopausal. Psych:  Alert and cooperative. Normal Erickson and affect. General:   Alert,  Well-developed, well-nourished, pleasant and cooperative in NAD Head:  Normocephalic and atraumatic. Eyes:  Sclera clear, no icterus.   Conjunctiva pink. Ears:  Normal auditory acuity. Lungs:  Respirations even and unlabored.  Clear throughout to auscultation.   No wheezes, crackles, or rhonchi. No acute distress. Heart:  Regular rate and rhythm; no murmurs, clicks, rubs, or gallops. Abdomen:  Normal bowel sounds.  No bruits.  Soft, non-tender and non-distended without masses, hepatosplenomegaly or hernias noted.  No guarding or rebound tenderness.    Neurologic:  Alert and oriented x3;  grossly normal neurologically. Psych:  Alert and cooperative. Normal Erickson and affect.  Imaging Studies: No results found.  Assessment and Plan:   Kaylee Erickson is a 54 y.o. y/o female has been referred for worsening of diarrhea- IBS.  Plan   1. Check stool for C diff, PCR, Calprotectin  2. Trial of Xifaxan for IBS-D 3. Colonoscopy to rule out microscopic colitis.  I have discussed alternative options, risks & benefits,  which include, but are not limited to, bleeding, infection, perforation,respiratory complication & drug reaction.  The patient agrees with this plan & written consent will be obtained.     Follow up in 6 weeks  Dr Jonathon Bellows MD,MRCP(U.K)

## 2019-11-13 LAB — CELIAC DISEASE AB SCREEN W/RFX
Antigliadin Abs, IgA: 11 units (ref 0–19)
IgA/Immunoglobulin A, Serum: 140 mg/dL (ref 87–352)
Transglutaminase IgA: <2 U/mL (ref 0–3)

## 2019-11-14 ENCOUNTER — Other Ambulatory Visit: Payer: Self-pay | Admitting: Gastroenterology

## 2019-11-14 ENCOUNTER — Other Ambulatory Visit: Payer: Self-pay

## 2019-11-14 ENCOUNTER — Other Ambulatory Visit
Admission: RE | Admit: 2019-11-14 | Discharge: 2019-11-14 | Disposition: A | Discharge status: Home / Self Care | Payer: PRIVATE HEALTH INSURANCE | Source: Ambulatory Visit | Attending: Gastroenterology | Admitting: Gastroenterology

## 2019-11-14 DIAGNOSIS — Z20822 Contact with and (suspected) exposure to covid-19: Secondary | ICD-10-CM | POA: Diagnosis not present

## 2019-11-14 DIAGNOSIS — Z01812 Encounter for preprocedural laboratory examination: Secondary | ICD-10-CM | POA: Diagnosis present

## 2019-11-14 LAB — SARS CORONAVIRUS 2 (TAT 6-24 HRS): SARS Coronavirus 2: NEGATIVE

## 2019-11-18 ENCOUNTER — Encounter: Payer: Self-pay | Admitting: Gastroenterology

## 2019-11-18 ENCOUNTER — Other Ambulatory Visit: Payer: Self-pay

## 2019-11-18 ENCOUNTER — Ambulatory Visit: Payer: PRIVATE HEALTH INSURANCE | Admitting: Certified Registered Nurse Anesthetist

## 2019-11-18 ENCOUNTER — Ambulatory Visit
Admission: RE | Admit: 2019-11-18 | Discharge: 2019-11-18 | Disposition: A | Discharge status: Home / Self Care | Payer: PRIVATE HEALTH INSURANCE | Attending: Gastroenterology | Admitting: Gastroenterology

## 2019-11-18 ENCOUNTER — Encounter
Admission: RE | Disposition: A | Discharge status: Home / Self Care | Payer: Self-pay | Source: Home / Self Care | Attending: Gastroenterology

## 2019-11-18 DIAGNOSIS — K529 Noninfective gastroenteritis and colitis, unspecified: Secondary | ICD-10-CM | POA: Diagnosis not present

## 2019-11-18 DIAGNOSIS — R197 Diarrhea, unspecified: Secondary | ICD-10-CM | POA: Diagnosis not present

## 2019-11-18 DIAGNOSIS — Z79899 Other long term (current) drug therapy: Secondary | ICD-10-CM | POA: Insufficient documentation

## 2019-11-18 DIAGNOSIS — Z87442 Personal history of urinary calculi: Secondary | ICD-10-CM | POA: Insufficient documentation

## 2019-11-18 HISTORY — PX: COLONOSCOPY WITH PROPOFOL: SHX5780

## 2019-11-18 HISTORY — DX: Guillain-Barre syndrome: G61.0

## 2019-11-18 HISTORY — DX: Personal history of urinary calculi: Z87.442

## 2019-11-18 LAB — CALPROTECTIN, FECAL: Calprotectin, Fecal: 30 ug/g (ref 0–120)

## 2019-11-18 LAB — GI PROFILE, STOOL, PCR

## 2019-11-18 LAB — CELIAC DISEASE AB SCREEN W/RFX
Antigliadin Abs, IgA: 11 units (ref 0–19)
IgA/Immunoglobulin A, Serum: 137 mg/dL (ref 87–352)
Transglutaminase IgA: <2 U/mL (ref 0–3)

## 2019-11-18 SURGERY — COLONOSCOPY WITH PROPOFOL
Anesthesia: General

## 2019-11-18 MED ORDER — SODIUM CHLORIDE 0.9 % IV SOLN: INTRAVENOUS | Status: DC

## 2019-11-18 MED ORDER — LIDOCAINE HCL (CARDIAC) PF 100 MG/5ML IV SOSY
PREFILLED_SYRINGE | INTRAVENOUS | Status: DC | PRN
Start: 2019-11-18 — End: 2019-11-18
  Administered 2019-11-18: 60 mg via INTRATRACHEAL

## 2019-11-18 MED ORDER — PROPOFOL 500 MG/50ML IV EMUL
INTRAVENOUS | Status: DC | PRN
  Administered 2019-11-18: 160 ug/kg/min via INTRAVENOUS

## 2019-11-18 MED ORDER — ONDANSETRON HCL 4 MG/2ML IJ SOLN: 4.0000 mg | Freq: Once | INTRAMUSCULAR | Status: DC | PRN

## 2019-11-18 MED ORDER — PROPOFOL 10 MG/ML IV BOLUS
INTRAVENOUS | Status: DC | PRN
  Administered 2019-11-18: 70 mg via INTRAVENOUS
  Administered 2019-11-18: 30 mg via INTRAVENOUS

## 2019-11-18 MED ORDER — FENTANYL CITRATE (PF) 100 MCG/2ML IJ SOLN: 25.0000 ug | INTRAMUSCULAR | Status: DC | PRN

## 2019-11-18 NOTE — Op Note (Signed)
[facility] Gastroenterology Patient Name: Kaylee Erickson Procedure Date: 11/18/2019 10:12 AM MRN: [id number] Account #: [id number] Date of Birth: 01/15/1966 Admit Type: Outpatient Age: 54 Room: [facility] ENDO ROOM 1 Gender: Female Note Status: Finalized Procedure:             Colonoscopy Indications:           Chronic diarrhea Providers:             Jonathon Bellows MD, MD Medicines:             Monitored Anesthesia Care Complications:         No immediate complications. Procedure:             Pre-Anesthesia Assessment:                        - Prior to the procedure, a History and Physical was                         performed, and patient medications, allergies and                         sensitivities were reviewed. The patient's tolerance                         of previous anesthesia was reviewed.                        - The risks and benefits of the procedure and the                         sedation options and risks were discussed with the                         patient. All questions were answered and informed                         consent was obtained.                        - ASA Grade Assessment: II - A patient with mild                         systemic disease.                        After obtaining informed consent, the colonoscope was                         passed under direct vision. Throughout the procedure,                         the patient's blood pressure, pulse, and oxygen                         saturations were monitored continuously. The                         Colonoscope was introduced through the anus and  advanced to the the terminal ileum. The colonoscopy                         was performed with ease. The patient tolerated the                         procedure well. The quality of the bowel preparation                         was excellent. Findings:      The terminal ileum appeared normal. Biopsies were taken  with a cold       forceps for histology.      The perianal exam findings include partial prolapse of the rectum on       bearing down      Normal mucosa was found in the entire colon. Biopsies were taken with a       cold forceps for histology.      The exam was otherwise without abnormality on direct and retroflexion       views. Impression:            - The examined portion of the ileum was normal.                         Biopsied.                        - Abnormal perianal exam.                        - Normal mucosa in the entire examined colon. Biopsied.                        - The examination was otherwise normal on direct and                         retroflexion views. Recommendation:        - Discharge patient to home (with escort).                        - Resume previous diet.                        - Continue present medications.                        - Await pathology results.                        - Return to my office as previously scheduled.                        - Refer to [facility] or [facility] to evaluate rectal                         prolapse Procedure Code(s):     --- Professional ---                        [phone], Colonoscopy, flexible; with biopsy, single or  multiple Diagnosis Code(s):     --- Professional ---                        K52.9, Noninfective gastroenteritis and colitis,                         unspecified CPT copyright 2019 American Medical Association. All rights reserved. The codes documented in this report are preliminary and upon coder review may  be revised to meet current compliance requirements. Wyline Mood, MD Wyline Mood MD, MD 11/18/2019 10:39:30 AM This report has been signed electronically. Number of Addenda: 0 Note Initiated On: 11/18/2019 10:12 AM Scope Withdrawal Time: 0 hours 12 minutes 30 seconds  Total Procedure Duration: 0 hours 17 minutes 46 seconds  Estimated Blood Loss:  Estimated blood loss: none.       [facility]

## 2019-11-18 NOTE — Anesthesia Preprocedure Evaluation (Signed)
Anesthesia Evaluation  Patient identified by MRN, date of birth, ID band Patient awake    Reviewed: Allergy & Precautions, NPO status , Patient's Chart, lab work & pertinent test results  Airway Mallampati: III       Dental   Pulmonary neg pulmonary ROS,           Cardiovascular negative cardio ROS       Neuro/Psych negative neurological ROS  negative psych ROS   GI/Hepatic Neg liver ROS,   Endo/Other  negative endocrine ROS  Renal/GU negative Renal ROS  Female GU complaint     Musculoskeletal negative musculoskeletal ROS (+)   Abdominal   Peds negative pediatric ROS (+)  Hematology negative hematology ROS (+)   Anesthesia Other Findings   Reproductive/Obstetrics negative OB ROS                             Anesthesia Physical Anesthesia Plan  ASA: II  Anesthesia Plan: General   Post-op Pain Management:    Induction: Intravenous  PONV Risk Score and Plan:   Airway Management Planned: Nasal Cannula  Additional Equipment:   Intra-op Plan:   Post-operative Plan:   Informed Consent: I have reviewed the patients History and Physical, chart, labs and discussed the procedure including the risks, benefits and alternatives for the proposed anesthesia with the patient or authorized representative who has indicated his/her understanding and acceptance.     Dental advisory given  Plan Discussed with: CRNA and Surgeon  Anesthesia Plan Comments:         Anesthesia Quick Evaluation

## 2019-11-18 NOTE — Transfer of Care (Signed)
Immediate Anesthesia Transfer of Care Note  Patient: Kaylee Erickson  Procedure(s) Performed: COLONOSCOPY WITH PROPOFOL (N/A )  Patient Location: PACU  Anesthesia Type:General  Level of Consciousness: awake and alert   Airway & Oxygen Therapy: Patient Spontanous Breathing and Patient connected to nasal cannula oxygen  Post-op Assessment: Report given to RN and Post -op Vital signs reviewed and stable  Post vital signs: Reviewed and stable  Last Vitals:  Vitals Value Taken Time  BP 101/61 11/18/19 1040  Temp    Pulse 68 11/18/19 1041  Resp 16 11/18/19 1041  SpO2 100 % 11/18/19 1041  Vitals shown include unvalidated device data.  Last Pain:  Vitals:   11/18/19 0946  TempSrc: Temporal  PainSc: 3          Complications: No complications documented.

## 2019-11-18 NOTE — H&P (Signed)
Wyline Mood, MD [address], [address], [place], [place], [address] [address], [address], [place], [place], [address] Phone: [phone]  Fax: [phone]  Primary Care Physician:  Marina Goodell, MD   Pre-Procedure History & Physical: HPI:  Kaylee Erickson is a 54 y.o. female is here for an colonoscopy.   Past Medical History:  Diagnosis Date  . History of kidney stones    1 Kidney stone at age 23    Past Surgical History:  Procedure Laterality Date  . CYSTOSCOPY W/ URETEROSCOPY W/ LITHOTRIPSY     at age 54  . uterine ablation      Prior to Admission medications   Medication Sig Start Date End Date Taking? Authorizing Provider  gabapentin (NEURONTIN) 300 MG capsule Take 300 mg by mouth 3 (three) times daily as needed.    Yes [provider]  cyanocobalamin 1000 MCG tablet Take 1,000 mcg by mouth daily. Patient not taking: Reported on 11/18/2019    [provider]  dicyclomine (BENTYL) 10 MG capsule Take 1 capsule (10 mg total) by mouth 4 (four) times daily -  before meals and at bedtime. Patient not taking: Reported on 11/11/2019 03/25/18   Midge Minium, MD  loratadine (CLARITIN) 10 MG tablet Take 1 tablet (10 mg total) by mouth daily as needed for allergies. For itching Patient not taking: Reported on 03/25/2018 01/13/16   Hassan Rowan, MD  rifaximin (XIFAXAN) 550 MG TABS tablet Take 1 tablet (550 mg total) by mouth 3 (three) times daily for 14 days. Patient not taking: Reported on 11/18/2019 11/11/19 11/25/19  Wyline Mood, MD    Allergies as of 11/12/2019 - Review Complete 11/11/2019  Allergen Reaction Noted  . Lactase Other (See Comments) 02/13/2014  . Other Other (See Comments) 02/13/2014  . Wasp venom Swelling 02/01/2016    History reviewed. No pertinent family history.  Social History   Socioeconomic History  . Marital status: Married    Spouse name: Not on file  . Number of children: Not on file  . Years of education: Not on file  .  Highest education level: Not on file  Occupational History  . Not on file  Tobacco Use  . Smoking status: Never Smoker  . Smokeless tobacco: Never Used  Vaping Use  . Vaping Use: Never used  Substance and Sexual Activity  . Alcohol use: Yes  . Drug use: No  . Sexual activity: Not on file  Other Topics Concern  . Not on file  Social History Narrative  . Not on file   Social Determinants of Health   Financial Resource Strain:   . Difficulty of Paying Living Expenses:   Food Insecurity:   . Worried About Programme researcher, broadcasting/film/video in the Last Year:   . Barista in the Last Year:   Transportation Needs:   . Freight forwarder (Medical):   Marland Kitchen Lack of Transportation (Non-Medical):   Physical Activity:   . Days of Exercise per Week:   . Minutes of Exercise per Session:   Stress:   . Feeling of Stress :   Social Connections:   . Frequency of Communication with Friends and Family:   . Frequency of Social Gatherings with Friends and Family:   . Attends Religious Services:   . Active Member of Clubs or Organizations:   . Attends Banker Meetings:   Marland Kitchen Marital Status:   Intimate Partner Violence:   . Fear of Current or  Ex-Partner:   . Emotionally Abused:   Marland Kitchen Physically Abused:   . Sexually Abused:     Review of Systems: See HPI, otherwise negative ROS  Physical Exam: BP 133/69   Pulse (!) 59   Temp (!) 97.2 F (36.2 C) (Temporal)   Resp 18   Ht 5\' 1"  (1.549 m)   Wt 52.2 kg   SpO2 100%   BMI 21.73 kg/m  General:   Alert,  pleasant and cooperative in NAD Head:  Normocephalic and atraumatic. Neck:  Supple; no masses or thyromegaly. Lungs:  Clear throughout to auscultation, normal respiratory effort.    Heart:  +S1, +S2, Regular rate and rhythm, No edema. Abdomen:  Soft, nontender and nondistended. Normal bowel sounds, without guarding, and without rebound.   Neurologic:  Alert and  oriented x4;  grossly normal  neurologically.  Impression/Plan: Kaylee Erickson is here for an colonoscopy to be performed for diarrhea. Risks, benefits, limitations, and alternatives regarding  colonoscopy have been reviewed with the patient.  Questions have been answered.  All parties agreeable.   Jonathon Bellows, MD  11/18/2019, 10:07 AM

## 2019-11-18 NOTE — Anesthesia Postprocedure Evaluation (Signed)
Anesthesia Post Note  Patient: Kaylee Erickson  Procedure(s) Performed: COLONOSCOPY WITH PROPOFOL (N/A )  Patient location during evaluation: Endoscopy Anesthesia Type: General Level of consciousness: awake and alert and oriented Pain management: pain level controlled Vital Signs Assessment: post-procedure vital signs reviewed and stable Respiratory status: spontaneous breathing Cardiovascular status: blood pressure returned to baseline Anesthetic complications: no   No complications documented.   Last Vitals:  Vitals:   11/18/19 1041 11/18/19 1111  BP:  135/81  Pulse:  60  Resp:    Temp: (!) 36.1 C   SpO2:      Last Pain:  Vitals:   11/18/19 1111  TempSrc:   PainSc: 0-No pain                 Gael Londo

## 2019-11-18 NOTE — Anesthesia Procedure Notes (Signed)
Performed by: Malva Cogan, CRNA Pre-anesthesia Checklist: Patient identified, Suction available, Emergency Drugs available, Patient being monitored and Timeout performed Oxygen Delivery Method: Nasal cannula Induction Type: IV induction

## 2019-11-19 ENCOUNTER — Encounter: Payer: Self-pay | Admitting: Gastroenterology

## 2019-11-19 LAB — C DIFFICILE TOXINS A+B W/RFLX: C difficile Toxins A+B, EIA: NEGATIVE

## 2019-11-19 LAB — C DIFFICILE, CYTOTOXIN B

## 2019-11-21 LAB — SURGICAL PATHOLOGY

## 2019-11-23 ENCOUNTER — Encounter: Payer: Self-pay | Admitting: Gastroenterology

## 2019-11-26 ENCOUNTER — Other Ambulatory Visit: Payer: Self-pay

## 2019-11-26 DIAGNOSIS — K623 Rectal prolapse: Secondary | ICD-10-CM

## 2019-12-30 ENCOUNTER — Telehealth: Payer: Self-pay

## 2019-12-30 NOTE — Telephone Encounter (Signed)
Pt left vm requesting Dr. Johnney Killian advice. Pt wants to know who'll be taking care of her rectal prolapse. Pt recently seen uro-gyn who recommended that pt follow up with GI or colorectal surgeon.

## 2019-12-30 NOTE — Telephone Encounter (Signed)
Inform   Not much a gastroenterologist can do for prolapse- Please set up a telephone appointment after I am back to discuss the issue further and then I will see how we can help direct her

## 2019-12-31 NOTE — Telephone Encounter (Signed)
Scheduled patient for 01/29/20 at 2:00

## 2020-01-27 ENCOUNTER — Other Ambulatory Visit: Payer: Self-pay

## 2020-01-27 ENCOUNTER — Ambulatory Visit (INDEPENDENT_AMBULATORY_CARE_PROVIDER_SITE_OTHER): Payer: No Typology Code available for payment source | Admitting: Gastroenterology

## 2020-01-27 VITALS — BP 161/83 | HR 88 | Temp 97.8°F | Ht 62.0 in | Wt 117.0 lb

## 2020-01-27 DIAGNOSIS — K623 Rectal prolapse: Secondary | ICD-10-CM | POA: Diagnosis not present

## 2020-01-27 NOTE — Progress Notes (Signed)
Wyline Mood MD, MRCP(U.K) [address]  [address]  [place], [place] [address]  Main: [phone]  Fax: [phone]   Primary Care Physician: Marina Goodell, MD  Primary Gastroenterologist:  Dr. Wyline Mood   Rectal prolapse   HPI: Kaylee Erickson is a 54 y.o. female    Summary of history :  Initially referred and seen in 11/2019 for IBS. Seen in our office in October 2019 by Dr. Servando Snare. Seen by Dr. Mechele Collin previously.   02/18/2019: Right upper quadrant ultrasound negative 02/10/2019 hemoglobin 13.1, MCV 101, CMP- normal . Last colonoscopy in 2011.  Recently worsening of diarrhea , has one after every meal, some blood at times and mucus, no abdominal pain, gas and bloating +.   Interval history   11/11/2019-01/27/2020 11/14/2019: Fecal calprotectin, celiac serology, stool GI PCR were normal.   11/18/2019: Colonoscopy : Rectal prolapse   She was seen on 12/26/2019 by the nurse practitioner at [facility].  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  Plan is for pelvic floor exercises and follow-up with GI for further management and possible surgical repair  Current Outpatient Medications  Medication Sig Dispense Refill  . cyanocobalamin 1000 MCG tablet Take 1,000 mcg by mouth daily. (Patient not taking: Reported on 11/18/2019)    . dicyclomine (BENTYL) 10 MG capsule Take 1 capsule (10 mg total) by mouth 4 (four) times daily -  before meals and at bedtime. (Patient not taking: Reported on 11/11/2019) 90 capsule 3  . gabapentin (NEURONTIN) 300 MG capsule Take 300 mg by mouth 3 (three) times daily as needed.     . loratadine (CLARITIN) 10 MG tablet Take 1 tablet (10 mg total) by mouth daily as needed for allergies. For itching (Patient not taking: Reported on 03/25/2018) 15 tablet 0   No current facility-administered medications for this visit.    Allergies as of 01/27/2020 - Review Complete 11/18/2019  Allergen Reaction Noted  . Milk-related compounds Other (See Comments)  11/18/2019  . Other Other (See Comments) 02/13/2014  . Wasp venom Swelling 02/01/2016    ROS:  General: Negative for anorexia, weight loss, fever, chills, fatigue, weakness. ENT: Negative for hoarseness, difficulty swallowing , nasal congestion. CV: Negative for chest pain, angina, palpitations, dyspnea on exertion, peripheral edema.  Respiratory: Negative for dyspnea at rest, dyspnea on exertion, cough, sputum, wheezing.  GI: See history of present illness. GU:  Negative for dysuria, hematuria, urinary incontinence, urinary frequency, nocturnal urination.  Endo: Negative for unusual weight change.    Physical Examination:   There were no vitals taken for this visit.  General: Well-nourished, well-developed in no acute distress.  Eyes: No icterus. Conjunctivae pink. Mouth: Oropharyngeal mucosa moist and pink , no lesions erythema or exudate. Lungs: Clear to auscultation bilaterally. Non-labored. Heart: Regular rate and rhythm, no murmurs rubs or gallops.  Abdomen: Bowel sounds are normal, nontender, nondistended, no hepatosplenomegaly or masses, no abdominal bruits or hernia , no rebound or guarding.   Extremities: No lower extremity edema. No clubbing or deformities. Neuro: Alert and oriented x 3.  Grossly intact. Skin: Warm and dry, no jaundice.   Psych: Alert and cooperative, normal mood and affect.   Imaging Studies: No results found.  Assessment and Plan:   Kaylee Erickson is a 54 y.o. y/o female here to follow up  for worsening of diarrhea- IBS.  She also has a rectal prolapse on examination.  She was recently seen by [facility] GYN and surprisingly was asked to follow-up with GI for  her rectal prolapse which is usually managed either conservatively with pelvic floor exercises or with surgery if conservative management fails.  Based on her history it appears that she probably needs surgery.  I will liaise with Dr. Maryjane Hurter today to mine whom we can refer her to for definite  surgical repair   Dr Wyline Mood  MD,MRCP [facility]) Follow up in as needed

## 2020-01-29 ENCOUNTER — Telehealth: Payer: PRIVATE HEALTH INSURANCE | Admitting: Gastroenterology

## 2020-12-27 IMAGING — US US ABDOMEN LIMITED
1 series · 14 of 25 positions shown · non-contrast
Comparison: Abdominal ultrasound 02/06/2017

CLINICAL DATA: Right upper quadrant pain for 7 months

EXAM:
ULTRASOUND ABDOMEN LIMITED RIGHT UPPER QUADRANT

[Series 1: us abdomen limited · 0.17mm/px · 14 of 46 slices shown]
[im 1/46]
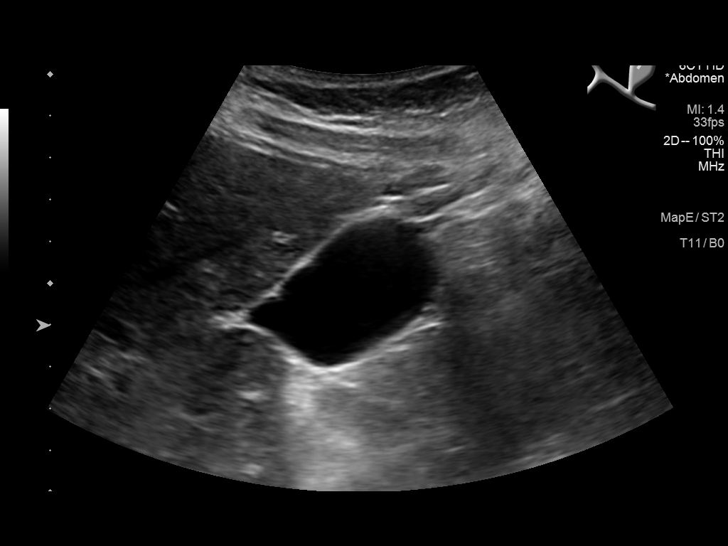
[im 4/46]
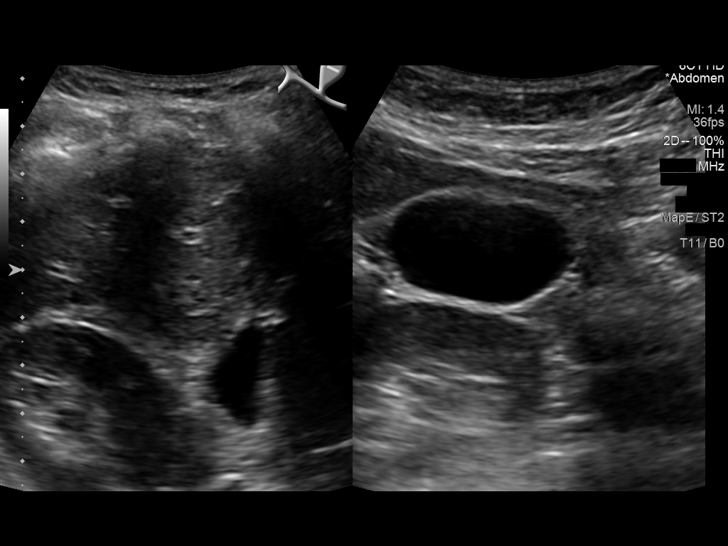
[im 8/46]
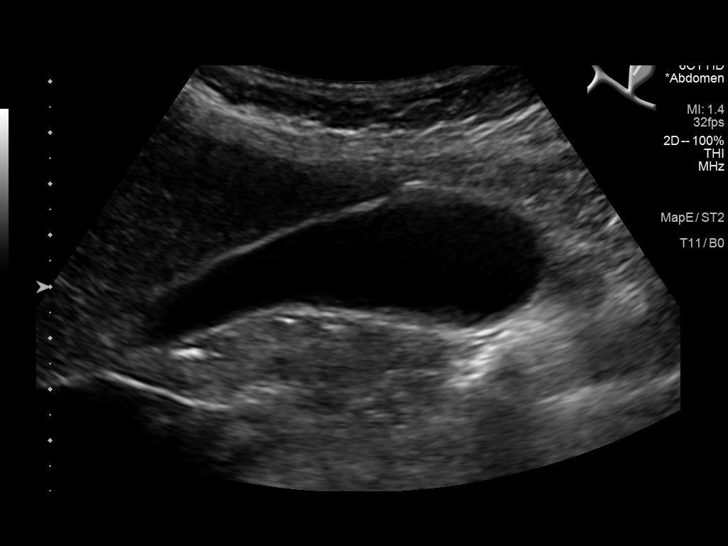
[im 12/46]
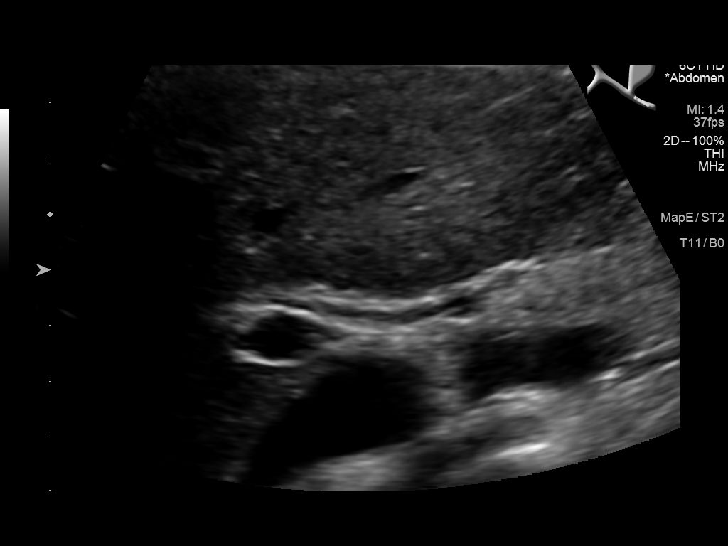
[im 16/46]
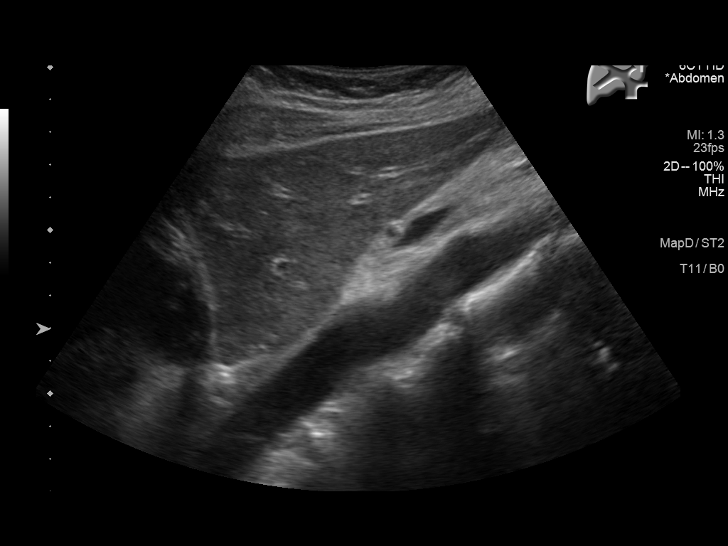
[im 17/46]
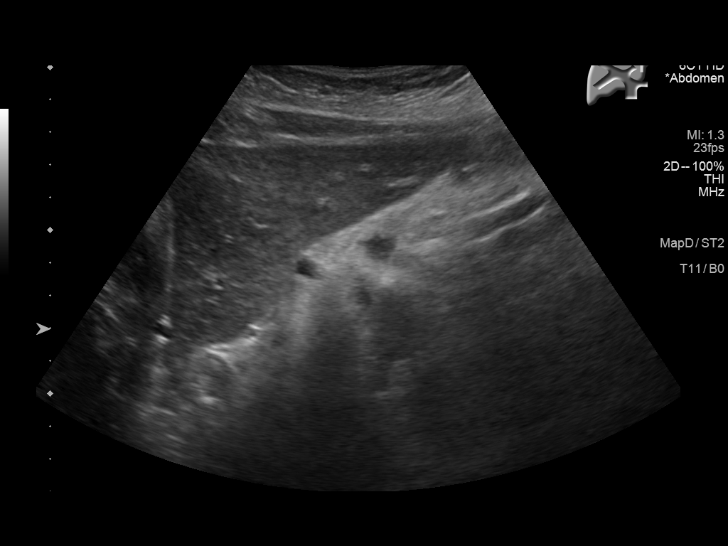
[im 21/46]
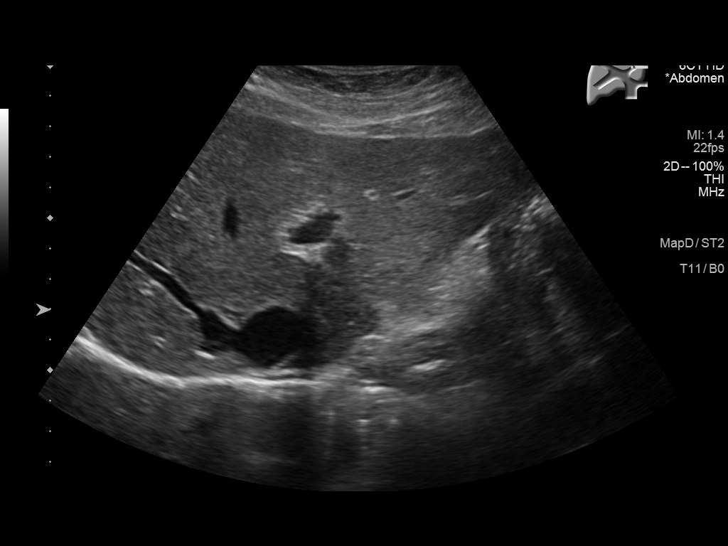
[im 25/46]
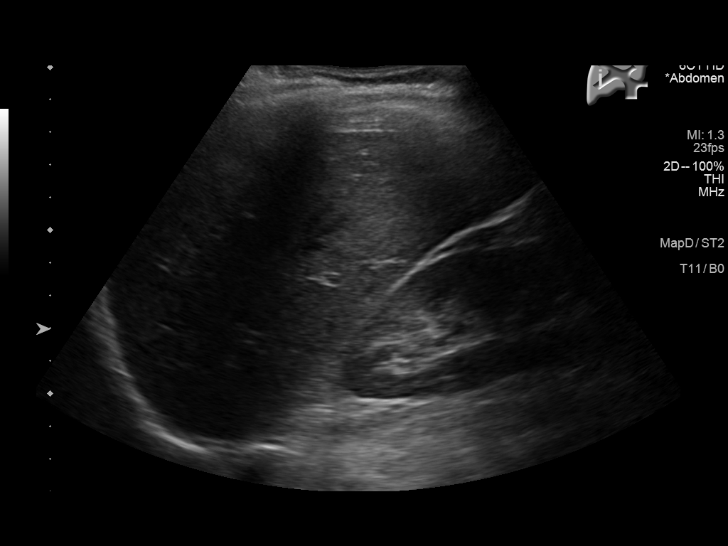
[im 29/46]
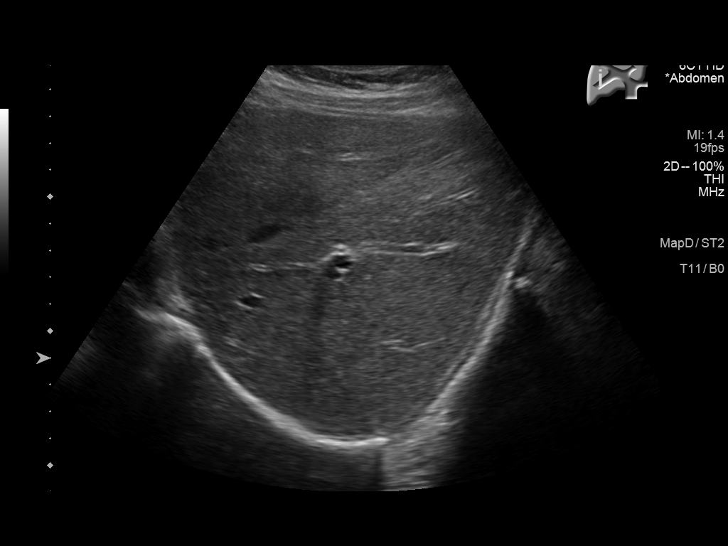
[im 31/46]
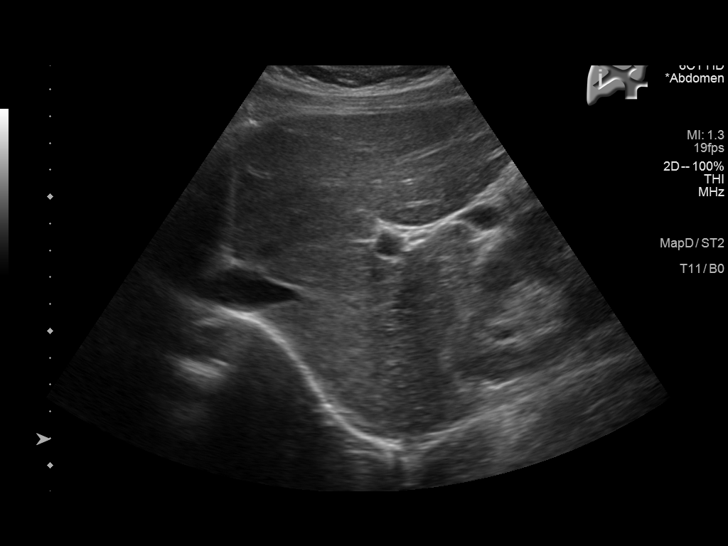
[im 34/46]
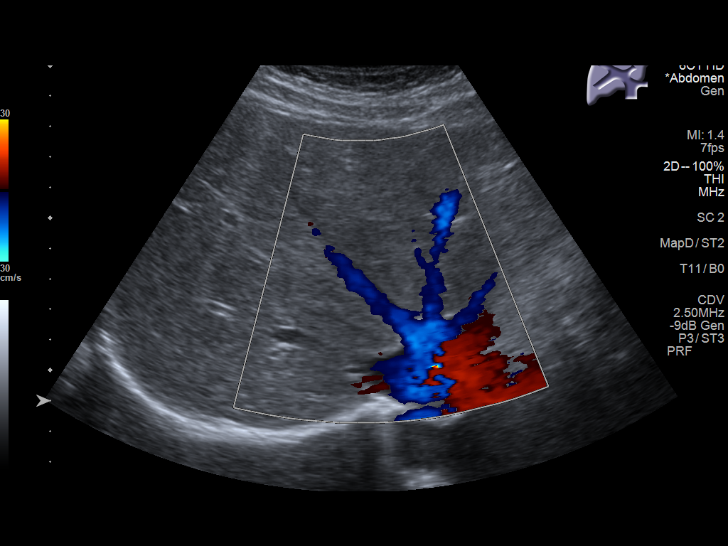
[im 38/46]
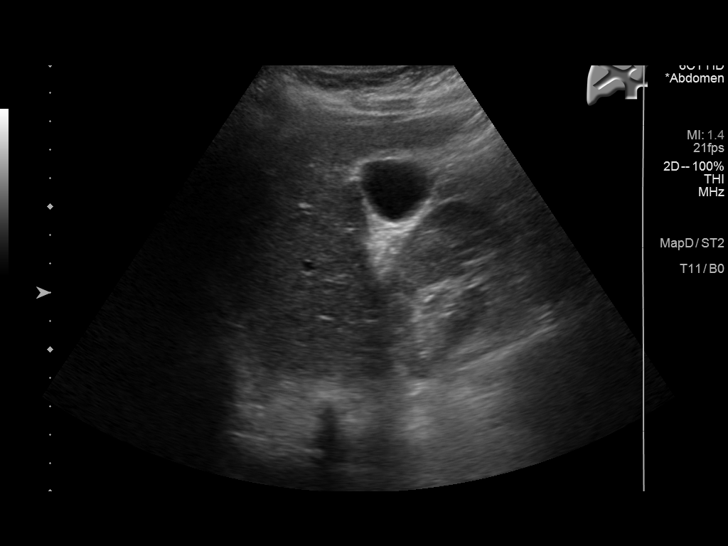
[im 42/46]
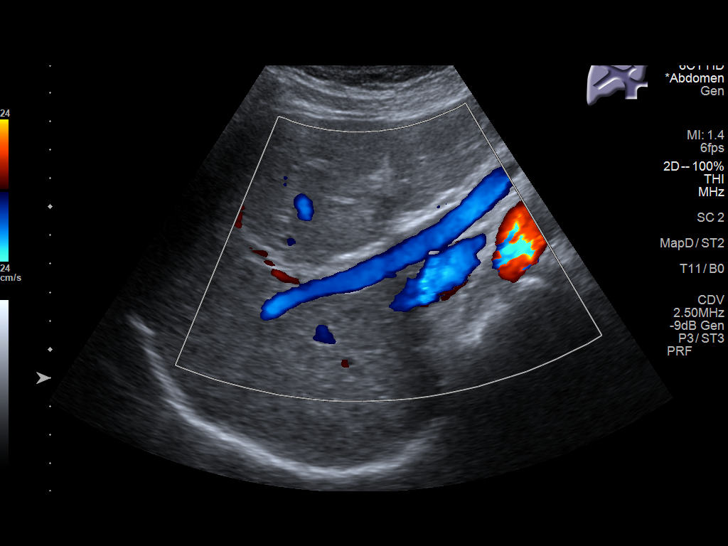
[im 46/46]
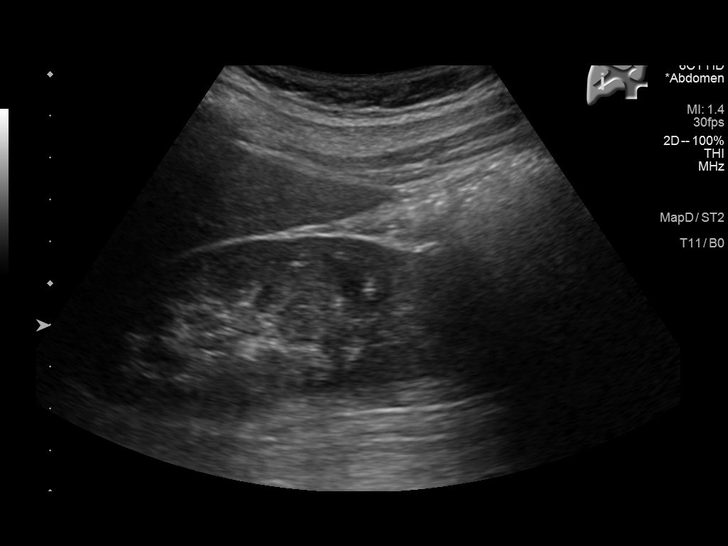

[14 of 25 positions shown; findings below may reference images not displayed]

FINDINGS: Gallbladder:

No gallstones or wall thickening visualized. No sonographic Murphy
sign noted by sonographer.

Common bile duct:

Diameter: 3.0 mm

Liver:

No focal lesion identified. Within normal limits in parenchymal
echogenicity. Portal vein is patent on color Doppler imaging with
normal direction of blood flow towards the liver.

Other: None.
IMPRESSION: No finding to explain patient's right upper quadrant pain.
